# Patient Record
Sex: Male | Born: 1935 | Race: White | Hispanic: No | Marital: Married | Smoking: Former smoker
Health system: Southern US, Community
[De-identification: ages and names within clinical notes are randomized; demographics above are authoritative.]

## PROBLEM LIST (undated history)

## (undated) DIAGNOSIS — M1990 Unspecified osteoarthritis, unspecified site: Secondary | ICD-10-CM

## (undated) DIAGNOSIS — G629 Polyneuropathy, unspecified: Secondary | ICD-10-CM

## (undated) DIAGNOSIS — R7303 Prediabetes: Secondary | ICD-10-CM

## (undated) DIAGNOSIS — I10 Essential (primary) hypertension: Secondary | ICD-10-CM

## (undated) HISTORY — PX: EYE SURGERY: SHX253

## (undated) HISTORY — PX: HERNIA REPAIR: SHX51

## (undated) HISTORY — PX: FINGER AMPUTATION: SHX636

## (undated) HISTORY — PX: HEMORRHOID SURGERY: SHX153

---

## 2015-03-11 ENCOUNTER — Ambulatory Visit (HOSPITAL_COMMUNITY)
Admission: EM | Admit: 2015-03-11 | Discharge: 2015-03-12 | Disposition: A | Discharge status: Home / Self Care | Payer: Medicare HMO | Attending: Orthopedic Surgery | Admitting: Orthopedic Surgery

## 2015-03-11 ENCOUNTER — Encounter (HOSPITAL_COMMUNITY): Payer: Self-pay

## 2015-03-11 ENCOUNTER — Emergency Department (HOSPITAL_COMMUNITY): Payer: Medicare HMO

## 2015-03-11 DIAGNOSIS — S66324A Laceration of extensor muscle, fascia and tendon of right ring finger at wrist and hand level, initial encounter: Secondary | ICD-10-CM | POA: Diagnosis not present

## 2015-03-11 DIAGNOSIS — Z79899 Other long term (current) drug therapy: Secondary | ICD-10-CM | POA: Insufficient documentation

## 2015-03-11 DIAGNOSIS — S67194A Crushing injury of right ring finger, initial encounter: Secondary | ICD-10-CM | POA: Diagnosis not present

## 2015-03-11 DIAGNOSIS — S6990XA Unspecified injury of unspecified wrist, hand and finger(s), initial encounter: Secondary | ICD-10-CM | POA: Diagnosis present

## 2015-03-11 DIAGNOSIS — S65514A Laceration of blood vessel of right ring finger, initial encounter: Secondary | ICD-10-CM | POA: Diagnosis not present

## 2015-03-11 DIAGNOSIS — S61219A Laceration without foreign body of unspecified finger without damage to nail, initial encounter: Secondary | ICD-10-CM

## 2015-03-11 DIAGNOSIS — R7303 Prediabetes: Secondary | ICD-10-CM | POA: Diagnosis not present

## 2015-03-11 DIAGNOSIS — S61212A Laceration without foreign body of right middle finger without damage to nail, initial encounter: Secondary | ICD-10-CM | POA: Diagnosis not present

## 2015-03-11 DIAGNOSIS — Y9389 Activity, other specified: Secondary | ICD-10-CM | POA: Diagnosis not present

## 2015-03-11 DIAGNOSIS — W3189XA Contact with other specified machinery, initial encounter: Secondary | ICD-10-CM | POA: Insufficient documentation

## 2015-03-11 DIAGNOSIS — S62609B Fracture of unspecified phalanx of unspecified finger, initial encounter for open fracture: Secondary | ICD-10-CM

## 2015-03-11 DIAGNOSIS — S62624B Displaced fracture of medial phalanx of right ring finger, initial encounter for open fracture: Secondary | ICD-10-CM | POA: Diagnosis not present

## 2015-03-11 DIAGNOSIS — S67192A Crushing injury of right middle finger, initial encounter: Secondary | ICD-10-CM | POA: Diagnosis not present

## 2015-03-11 DIAGNOSIS — R52 Pain, unspecified: Secondary | ICD-10-CM

## 2015-03-11 DIAGNOSIS — Z87891 Personal history of nicotine dependence: Secondary | ICD-10-CM | POA: Diagnosis not present

## 2015-03-11 DIAGNOSIS — G629 Polyneuropathy, unspecified: Secondary | ICD-10-CM | POA: Diagnosis not present

## 2015-03-11 DIAGNOSIS — I10 Essential (primary) hypertension: Secondary | ICD-10-CM | POA: Diagnosis not present

## 2015-03-11 HISTORY — DX: Essential (primary) hypertension: I10

## 2015-03-11 HISTORY — DX: Polyneuropathy, unspecified: G62.9

## 2015-03-11 HISTORY — DX: Prediabetes: R73.03

## 2015-03-11 MED ORDER — CEFAZOLIN SODIUM 1-5 GM-% IV SOLN
1.0000 g | Freq: Once | INTRAVENOUS | Status: AC
  Administered 2015-03-11: 1 g via INTRAVENOUS
  Filled 2015-03-11: qty 50

## 2015-03-11 MED ORDER — LIDOCAINE HCL 2 % IJ SOLN
10.0000 mL | Freq: Once | INTRAMUSCULAR | Status: DC
  Filled 2015-03-11: qty 20

## 2015-03-11 MED ORDER — HYDROCODONE-ACETAMINOPHEN 5-325 MG PO TABS
2.0000 | ORAL_TABLET | Freq: Once | ORAL | Status: AC
Start: 2015-03-11 — End: 2015-03-11
  Administered 2015-03-11: 2 via ORAL
  Filled 2015-03-11: qty 2

## 2015-03-11 MED ORDER — MORPHINE SULFATE (PF) 4 MG/ML IV SOLN: 4.0000 mg | Freq: Once | INTRAVENOUS | Status: DC

## 2015-03-11 NOTE — ED Provider Notes (Signed)
CSN: [id number]     Arrival date & time 03/11/15  1848 History   First MD Initiated Contact with Patient 03/11/15 1851     Chief Complaint  Patient presents with  . Finger Injury     (Consider location/radiation/quality/duration/timing/severity/associated sxs/prior Treatment) Patient is a 80 y.o. male presenting with hand injury.  Hand Injury Location:  Finger Time since incident:  1 hour Injury: yes   Mechanism of injury: crush   Crush injury:    Mechanism: wood splitter. Finger location:  R middle finger and R ring finger Pain details:    Radiates to:  Does not radiate   Severity:  Moderate   Onset quality:  Sudden   Duration:  1 hour   Timing:  Constant   Progression:  Unchanged Handedness:  Right-handed Tetanus status:  Up to date Relieved by:  Nothing Worsened by:  Movement Ineffective treatments:  None tried Associated symptoms: swelling   Associated symptoms: no numbness     No past medical history on file. No past surgical history on file. No family history on file. Social History  Substance Use Topics  . Smoking status: Not on file  . Smokeless tobacco: Not on file  . Alcohol Use: Not on file    Review of Systems  All other systems reviewed and are negative.     Allergies  Review of patient's allergies indicates not on file.  Home Medications   Prior to Admission medications   Not on File   BP 133/81 mmHg  Pulse 69  Temp(Src) 98 F (36.7 C) (Oral)  Resp 18  SpO2 98% Physical Exam  Constitutional: He is oriented to person, place, and time. He appears well-developed and well-nourished. No distress.  HENT:  Head: Normocephalic and atraumatic.  Eyes: Conjunctivae are normal. No scleral icterus.  Neck: Neck supple.  Cardiovascular: Normal rate and intact distal pulses.   Pulmonary/Chest: Effort normal. No stridor. No respiratory distress.  Abdominal: Normal appearance. He exhibits no distension.  Musculoskeletal:        Hands: Neurological: He is alert and oriented to person, place, and time.  Skin: Skin is warm and dry. No rash noted.  Psychiatric: He has a normal mood and affect. His behavior is normal.  Nursing note and vitals reviewed.   ED Course  Procedures (including critical care time) Labs Review Labs Reviewed - No data to display  Imaging Review Dg Hand Complete Right  03/11/2015  CLINICAL DATA:  Right hand pain. Laceration to ring and middle fingers with wood splitter today. EXAM: RIGHT HAND - COMPLETE 3+ VIEW COMPARISON:  None. FINDINGS: Oblique mildly displaced fracture of the fourth digit middle phalanx, no intra-articular extension. No definite additional acute fracture, allowing for a difficulty with positioning and background chronic change. Soft tissue air is noted about the middle and ring fingers consistent with laceration with overlying dressing in place. No radiopaque foreign body. 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 Remote amputation of fifth digit at the level of the proximal phalanx. IMPRESSION: 1. Acute mildly displaced oblique fracture of the fourth digit middle phalanx. 2. Soft tissue air about the third and fourth digits consistent with laceration and soft tissue injury, no radiopaque foreign body. 3. 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 Electronically Signed   By: Rubye Oaks M.D.   On: 03/11/2015 19:51   I have personally reviewed and evaluated these images and lab results as  part of my medical decision-making.   EKG Interpretation None      MDM   Final diagnoses:  Finger fracture, right, open, initial encounter  Finger laceration, initial encounter    80 yo male who injured his right 3rd and 4th fingers while using a wood splitter.  Morphine for pain.  Plain films pending.   After pain meds facilitated better exam, he had deficit to extension of DIP on ring  finger.  Plain film showed fracture of 4th digit.  Given IV ancef. Tetanus is up to date.    I spoke with Dr. Merlyn LotKuzma at approximately 23:15 and he asked [facility].  ED will accept (Dr. Dalene SeltzerSchlossman).     Blake DivineJohn Kaiyla Stahly, MD 03/12/15 [phone]

## 2015-03-11 NOTE — ED Notes (Signed)
Bed: WA20 Expected date:  Expected time:  Means of arrival:  Comments: Hold for triage 

## 2015-03-11 NOTE — ED Notes (Signed)
Pt verbalizes understanding of transfer instructions.

## 2015-03-11 NOTE — ED Notes (Signed)
Pt c/o R middle and ring finger injury after getting them crushed in a wood splitter.  Pain score 5/10.  Denies blood thinners.  Compound fractures noted.

## 2015-03-11 NOTE — ED Notes (Signed)
Sterile non stick dressing applied to wound and wrapped in kerlix.

## 2015-03-12 ENCOUNTER — Emergency Department (HOSPITAL_COMMUNITY): Payer: Medicare HMO | Admitting: Anesthesiology

## 2015-03-12 ENCOUNTER — Emergency Department (HOSPITAL_COMMUNITY): Payer: Medicare HMO

## 2015-03-12 ENCOUNTER — Encounter (HOSPITAL_COMMUNITY)
Admission: EM | Disposition: A | Discharge status: Home / Self Care | Payer: Self-pay | Source: Home / Self Care | Attending: Emergency Medicine

## 2015-03-12 DIAGNOSIS — S67192A Crushing injury of right middle finger, initial encounter: Secondary | ICD-10-CM | POA: Diagnosis not present

## 2015-03-12 DIAGNOSIS — S61212A Laceration without foreign body of right middle finger without damage to nail, initial encounter: Secondary | ICD-10-CM | POA: Diagnosis not present

## 2015-03-12 DIAGNOSIS — S62624B Displaced fracture of medial phalanx of right ring finger, initial encounter for open fracture: Secondary | ICD-10-CM | POA: Diagnosis not present

## 2015-03-12 DIAGNOSIS — S67194A Crushing injury of right ring finger, initial encounter: Secondary | ICD-10-CM | POA: Diagnosis not present

## 2015-03-12 HISTORY — PX: I & D EXTREMITY: SHX5045

## 2015-03-12 LAB — CBC WITH DIFFERENTIAL/PLATELET
Basophils Absolute: 0.1 10*3/uL (ref 0.0–0.1)
Basophils Relative: 1 %
EOS ABS: 0.1 10*3/uL (ref 0.0–0.7)
Eosinophils Relative: 1 %
HCT: 37.9 % — ABNORMAL LOW (ref 39.0–52.0)
HEMOGLOBIN: 13 g/dL (ref 13.0–17.0)
Lymphocytes Relative: 23 %
Lymphs Abs: 2.1 10*3/uL (ref 0.7–4.0)
MCH: 30.8 pg (ref 26.0–34.0)
MCHC: 34.3 g/dL (ref 30.0–36.0)
MCV: 89.8 fL (ref 78.0–100.0)
Monocytes Absolute: 0.9 10*3/uL (ref 0.1–1.0)
Monocytes Relative: 10 %
NEUTROS PCT: 65 %
Neutro Abs: 6.1 10*3/uL (ref 1.7–7.7)
Platelets: 197 10*3/uL (ref 150–400)
RBC: 4.22 MIL/uL (ref 4.22–5.81)
RDW: 12.9 % (ref 11.5–15.5)
WBC: 9.3 10*3/uL (ref 4.0–10.5)

## 2015-03-12 LAB — BASIC METABOLIC PANEL
Anion gap: 8 (ref 5–15)
BUN: 29 mg/dL — ABNORMAL HIGH (ref 6–20)
CHLORIDE: 106 mmol/L (ref 101–111)
CO2: 22 mmol/L (ref 22–32)
CREATININE: 1.32 mg/dL — AB (ref 0.61–1.24)
Calcium: 8.9 mg/dL (ref 8.9–10.3)
GFR, EST AFRICAN AMERICAN: 58 mL/min — AB (ref >60)
GFR, EST NON AFRICAN AMERICAN: 50 mL/min — AB (ref >60)
Glucose, Bld: 109 mg/dL — ABNORMAL HIGH (ref 65–99)
POTASSIUM: 4.4 mmol/L (ref 3.5–5.1)
SODIUM: 136 mmol/L (ref 135–145)

## 2015-03-12 SURGERY — IRRIGATION AND DEBRIDEMENT EXTREMITY
Anesthesia: General | Site: Finger | Laterality: Right

## 2015-03-12 MED ORDER — ONDANSETRON HCL 4 MG/2ML IJ SOLN
INTRAMUSCULAR | Status: AC
  Filled 2015-03-12: qty 2

## 2015-03-12 MED ORDER — PROMETHAZINE HCL 25 MG/ML IJ SOLN
INTRAMUSCULAR | Status: AC
  Filled 2015-03-12: qty 1

## 2015-03-12 MED ORDER — PROPOFOL 10 MG/ML IV BOLUS
INTRAVENOUS | Status: DC | PRN
  Administered 2015-03-12: 160 mg via INTRAVENOUS

## 2015-03-12 MED ORDER — BUPIVACAINE HCL (PF) 0.25 % IJ SOLN
INTRAMUSCULAR | Status: AC
  Filled 2015-03-12: qty 30

## 2015-03-12 MED ORDER — ONDANSETRON HCL 4 MG/2ML IJ SOLN
INTRAMUSCULAR | Status: DC | PRN
  Administered 2015-03-12: 4 mg via INTRAVENOUS

## 2015-03-12 MED ORDER — SULFAMETHOXAZOLE-TRIMETHOPRIM 800-160 MG PO TABS: 1.0000 | ORAL_TABLET | Freq: Two times a day (BID) | ORAL | Status: DC

## 2015-03-12 MED ORDER — EPHEDRINE SULFATE 50 MG/ML IJ SOLN
INTRAMUSCULAR | Status: DC | PRN
  Administered 2015-03-12: 10 mg via INTRAVENOUS

## 2015-03-12 MED ORDER — FENTANYL CITRATE (PF) 250 MCG/5ML IJ SOLN
INTRAMUSCULAR | Status: AC
  Filled 2015-03-12: qty 5

## 2015-03-12 MED ORDER — CEFAZOLIN SODIUM-DEXTROSE 2-3 GM-% IV SOLR
INTRAVENOUS | Status: AC
  Filled 2015-03-12: qty 50

## 2015-03-12 MED ORDER — PROMETHAZINE HCL 25 MG/ML IJ SOLN
6.2500 mg | Freq: Once | INTRAMUSCULAR | Status: AC
  Administered 2015-03-12: 6.25 mg via INTRAVENOUS

## 2015-03-12 MED ORDER — SODIUM CHLORIDE 0.9 % IR SOLN
Status: DC | PRN
  Administered 2015-03-12: 3000 mL

## 2015-03-12 MED ORDER — LIDOCAINE HCL (CARDIAC) 20 MG/ML IV SOLN
INTRAVENOUS | Status: DC | PRN
  Administered 2015-03-12: 60 mg via INTRAVENOUS

## 2015-03-12 MED ORDER — OXYCODONE-ACETAMINOPHEN 5-325 MG PO TABS: ORAL_TABLET | ORAL | Status: DC

## 2015-03-12 MED ORDER — MIDAZOLAM HCL 2 MG/2ML IJ SOLN
INTRAMUSCULAR | Status: AC
  Filled 2015-03-12: qty 4

## 2015-03-12 MED ORDER — MORPHINE SULFATE (PF) 2 MG/ML IV SOLN
2.0000 mg | Freq: Once | INTRAVENOUS | Status: AC
  Administered 2015-03-12: 2 mg via INTRAVENOUS
  Filled 2015-03-12: qty 1

## 2015-03-12 MED ORDER — FENTANYL CITRATE (PF) 250 MCG/5ML IJ SOLN
INTRAMUSCULAR | Status: DC | PRN
  Administered 2015-03-12: 75 ug via INTRAVENOUS
  Administered 2015-03-12: 25 ug via INTRAVENOUS
  Administered 2015-03-12: 50 ug via INTRAVENOUS

## 2015-03-12 MED ORDER — LACTATED RINGERS IV SOLN
INTRAVENOUS | Status: DC | PRN
  Administered 2015-03-12 (×2): via INTRAVENOUS

## 2015-03-12 MED ORDER — BUPIVACAINE HCL (PF) 0.25 % IJ SOLN
INTRAMUSCULAR | Status: DC | PRN
  Administered 2015-03-12: 10 mL

## 2015-03-12 MED ORDER — MIDAZOLAM HCL 2 MG/2ML IJ SOLN
INTRAMUSCULAR | Status: DC | PRN
  Administered 2015-03-12: 2 mg via INTRAVENOUS

## 2015-03-12 MED ORDER — SODIUM CHLORIDE 0.9 % IR SOLN
Status: DC | PRN
  Administered 2015-03-12: 1

## 2015-03-12 MED ORDER — FENTANYL CITRATE (PF) 100 MCG/2ML IJ SOLN: 25.0000 ug | INTRAMUSCULAR | Status: DC | PRN

## 2015-03-12 MED ORDER — MORPHINE SULFATE (PF) 4 MG/ML IV SOLN
4.0000 mg | Freq: Once | INTRAVENOUS | Status: AC
  Administered 2015-03-12: 4 mg via INTRAVENOUS
  Filled 2015-03-12: qty 1

## 2015-03-12 MED ORDER — ONDANSETRON HCL 4 MG/2ML IJ SOLN
4.0000 mg | Freq: Once | INTRAMUSCULAR | Status: AC
  Administered 2015-03-12: 4 mg via INTRAVENOUS
  Filled 2015-03-12: qty 2

## 2015-03-12 MED ORDER — CEFAZOLIN SODIUM-DEXTROSE 2-3 GM-% IV SOLR
INTRAVENOUS | Status: DC | PRN
  Administered 2015-03-12: 2 g via INTRAVENOUS

## 2015-03-12 MED ORDER — SUCCINYLCHOLINE CHLORIDE 20 MG/ML IJ SOLN
INTRAMUSCULAR | Status: DC | PRN
  Administered 2015-03-12: 100 mg via INTRAVENOUS

## 2015-03-12 SURGICAL SUPPLY — 61 items
BANDAGE COBAN STERILE 2 (GAUZE/BANDAGES/DRESSINGS) IMPLANT
BANDAGE ELASTIC 3 VELCRO ST LF (GAUZE/BANDAGES/DRESSINGS) IMPLANT
BANDAGE ELASTIC 4 VELCRO ST LF (GAUZE/BANDAGES/DRESSINGS) ×3 IMPLANT
BNDG COHESIVE 1X5 TAN STRL LF (GAUZE/BANDAGES/DRESSINGS) IMPLANT
BNDG CONFORM 2 STRL LF (GAUZE/BANDAGES/DRESSINGS) IMPLANT
BNDG ESMARK 4X9 LF (GAUZE/BANDAGES/DRESSINGS) IMPLANT
BNDG GAUZE ELAST 4 BULKY (GAUZE/BANDAGES/DRESSINGS) ×3 IMPLANT
CORDS BIPOLAR (ELECTRODE) ×3 IMPLANT
COVER SURGICAL LIGHT HANDLE (MISCELLANEOUS) ×3 IMPLANT
DECANTER SPIKE VIAL GLASS SM (MISCELLANEOUS) ×3 IMPLANT
DRAIN PENROSE 1/4X12 LTX STRL (WOUND CARE) IMPLANT
DRAPE MICROSCOPE LEICA (MISCELLANEOUS) ×3 IMPLANT
DRAPE OEC MINIVIEW 54X84 (DRAPES) ×3 IMPLANT
DRSG ADAPTIC 3X8 NADH LF (GAUZE/BANDAGES/DRESSINGS) IMPLANT
DRSG EMULSION OIL 3X3 NADH (GAUZE/BANDAGES/DRESSINGS) IMPLANT
DRSG PAD ABDOMINAL 8X10 ST (GAUZE/BANDAGES/DRESSINGS) ×6 IMPLANT
GAUZE SPONGE 4X4 12PLY STRL (GAUZE/BANDAGES/DRESSINGS) IMPLANT
GAUZE XEROFORM 1X8 LF (GAUZE/BANDAGES/DRESSINGS) ×3 IMPLANT
GLOVE BIO SURGEON STRL SZ7.5 (GLOVE) ×3 IMPLANT
GLOVE BIOGEL PI IND STRL 8 (GLOVE) ×1 IMPLANT
GLOVE BIOGEL PI INDICATOR 8 (GLOVE) ×2
GOWN STRL REUS W/ TWL LRG LVL3 (GOWN DISPOSABLE) ×1 IMPLANT
GOWN STRL REUS W/TWL LRG LVL3 (GOWN DISPOSABLE) ×2
K-WIRE 0.9MMX 102MM ×6 IMPLANT
KIT BASIN OR (CUSTOM PROCEDURE TRAY) ×3 IMPLANT
KIT ROOM TURNOVER OR (KITS) ×3 IMPLANT
LOOP VESSEL MAXI BLUE (MISCELLANEOUS) IMPLANT
LOOP VESSEL MINI RED (MISCELLANEOUS) IMPLANT
MANIFOLD NEPTUNE II (INSTRUMENTS) ×3 IMPLANT
NEEDLE HYPO 25X1 1.5 SAFETY (NEEDLE) ×3 IMPLANT
NS IRRIG 1000ML POUR BTL (IV SOLUTION) ×3 IMPLANT
PACK ORTHO EXTREMITY (CUSTOM PROCEDURE TRAY) ×3 IMPLANT
PAD ARMBOARD 7.5X6 YLW CONV (MISCELLANEOUS) ×6 IMPLANT
PADDING CAST ABS 4INX4YD NS (CAST SUPPLIES) ×2
PADDING CAST ABS COTTON 4X4 ST (CAST SUPPLIES) ×1 IMPLANT
SCRUB BETADINE 4OZ XXX (MISCELLANEOUS) ×3 IMPLANT
SET CYSTO W/LG BORE CLAMP LF (SET/KITS/TRAYS/PACK) ×3 IMPLANT
SOLUTION BETADINE 4OZ (MISCELLANEOUS) ×3 IMPLANT
SPLINT FIBERGLASS 3X12 (CAST SUPPLIES) ×3 IMPLANT
SPONGE GAUZE 4X4 12PLY STER LF (GAUZE/BANDAGES/DRESSINGS) ×3 IMPLANT
SPONGE LAP 18X18 X RAY DECT (DISPOSABLE) ×3 IMPLANT
SPONGE LAP 4X18 X RAY DECT (DISPOSABLE) ×3 IMPLANT
SUCTION FRAZIER TIP 10 FR DISP (SUCTIONS) ×3 IMPLANT
SUT CHROMIC 6 0 PS 4 (SUTURE) ×3 IMPLANT
SUT ETHILON 4 0 FS 1 (SUTURE) ×3 IMPLANT
SUT ETHILON 4 0 P 3 18 (SUTURE) ×6 IMPLANT
SUT ETHILON 4 0 PS 2 18 (SUTURE) ×3 IMPLANT
SUT ETHILON 9 0 BV130 4 (SUTURE) ×3 IMPLANT
SUT MERSILENE 4 0 P 3 (SUTURE) ×3 IMPLANT
SUT MON AB 5-0 P3 18 (SUTURE) IMPLANT
SUT PROLENE 3 0 PS 2 (SUTURE) ×3 IMPLANT
SYR CONTROL 10ML LL (SYRINGE) IMPLANT
TOWEL OR 17X24 6PK STRL BLUE (TOWEL DISPOSABLE) ×3 IMPLANT
TOWEL OR 17X26 10 PK STRL BLUE (TOWEL DISPOSABLE) ×3 IMPLANT
TUBE ANAEROBIC SPECIMEN COL (MISCELLANEOUS) IMPLANT
TUBE CONNECTING 12'X1/4 (SUCTIONS) ×1
TUBE CONNECTING 12X1/4 (SUCTIONS) ×2 IMPLANT
TUBE FEEDING 5FR 15 INCH (TUBING) IMPLANT
UNDERPAD 30X30 INCONTINENT (UNDERPADS AND DIAPERS) ×3 IMPLANT
WATER STERILE IRR 1000ML POUR (IV SOLUTION) ×3 IMPLANT
YANKAUER SUCT BULB TIP NO VENT (SUCTIONS) ×3 IMPLANT

## 2015-03-12 NOTE — Op Note (Signed)
NAME:  Jeffery Kennedy, Jeffery Kennedy                ACCOUNT NO.:  [id number]  MEDICAL RECORD NO.:  [id number]  LOCATION:  [facility]                         FACILITY:  [facility]  PHYSICIAN:  Betha LoaKevin Sudie Bandel, MD        DATE OF BIRTH:  [birth year]  DATE OF PROCEDURE:  03/12/2015 DATE OF DISCHARGE:                              OPERATIVE REPORT   PREOPERATIVE DIAGNOSIS:  Right long and ring finger crush injuries with open middle phalanx fracture, ring finger.  POSTOPERATIVE DIAGNOSIS:  Right long finger laceration with nail bed laceration, right long finger open middle phalanx fracture, extensor tendon laceration, and radial digital artery laceration.  PROCEDURE:   1. Irrigation and debridement including bone of right ring finger open middle phalanx fracture 2. Open reduction and pinning of right ring finger open middle phalanx fracture 3. Repair of extensor tendon right ring finger 4. Repair of right ring finger radial digital artery under microscope 5. Right long finger repair of laceration and nail bed lacerations.  SURGEON:  Betha LoaKevin Nyimah Shadduck, MD.  ASSISTANT:  None.  ANESTHESIA:  General.  IV FLUIDS:  Per anesthesia flow sheet.  ESTIMATED BLOOD LOSS:  Minimal.  COMPLICATIONS:  None.  SPECIMENS:  None.  TIME OF TOURNIQUET:  100 minutes.  DISPOSITION:  Stable to PACU.  INDICATIONS:  Mr. Jeffery Kennedy is a 80 year old right-hand dominant male, who yesterday evening injured his right hand while using a log splitter. While in the emergency room, he was evaluated and found to have open fracture.  [facility]  On evaluation, he had intact capillary refill in the fingertips.  He had decreased sensation in the ring finger.  He had lack of extension of the ring finger at the DIP joint.  There were lacerations of the ring and long finger.  Radiographs showed a fracture of the middle phalanx of the ring finger.  I recommended going to the operating room for irrigation and debridement of  open fracture, reduction and pinning of fracture, and repair of tendons as necessary.  Risks, benefits, and alternatives of surgery were discussed including risk of blood loss; infection; damage to nerves, vessels, tendons, ligaments, bone; failure of surgery; need for additional surgery; complications with wound healing; continued pain; nonunion; malunion; stiffness.  He voiced understanding of these risks and elected to proceed.  OPERATIVE COURSE:  Starting in operating room was delayed due to lack of open operating rooms available.  After being identified preoperatively by myself, the patient and I agreed upon procedure and site of procedure.  Surgical site was marked.  The risks, benefits, and alternatives of surgery were reviewed, and he wished to proceed. Surgical consent had been signed.  He was given IV Ancef as preoperative antibiotic coverage.  He had been given Ancef and his tetanus updated in the emergency department.  He was transferred to the operating room and placed on the operating table in supine position with the right upper extremity on arm board.  General anesthesia was induced by Anesthesiology.  Right upper extremity was prepped and draped in normal sterile orthopedic fashion.  Surgical pause was performed between surgeons, anesthesia, operating staff, and all were in agreement as to  the patient, procedure, and site of procedure.  Tourniquet at the proximal aspect of the extremity was inflated to 250 mmHg after exsanguination of limb with an Esmarch bandage.  The long finger was examined.  There was a laceration longitudinally.  There was some abrasion to the extensor tendon, but no complete laceration.  The laceration went into the nail.  The nail was removed.  There was laceration of the nail bed.  The ring finger was examined.  There was avulsion of the extensor tendon from the distal phalanx.  The joint was open.  The fracture was exposed in the middle phalanx.   The ulnar digital nerve and artery were identified and were intact.  The radial digital nerve was intact and the radial digital artery was lacerated past the trifurcation.  The wounds and open fracture were copiously irrigated with 4000 mL of sterile saline by cysto tubing.  A piece of  devascularlized bone was removed from the fracture site.  The skin and nail bed laceration of the long finger were repaired with 4-0 nylon suture in the skin and 6-0 chromic suture in the nail bed.  This was adequate to provide good apposition of tissues.  An open reduction of the ring finger middle phalanx fracture was performed.  Two 0.035-inch K- wires were then used in a crossed fashion from the proximal aspect of the phalanx across the fracture site and into the distal aspect of the phalanx.  This was adequate to stabilize the fracture.  The extensor tendon was repaired back down to the bone by passing a 3-0 Prolene in a horizontal mattress fashion over the top of the dorsal skin.  This was able to hold the tendon into apposition with the insertion of the distal phalanx.  A 0.035-inch K-wire had been advanced from the tip of the finger across the DIP joint to stabilize the joint.  The C-arm was used in AP, lateral, and oblique projections throughout the case to aid in reduction and positioning of hardware and good reduction was obtained. The pins were bent and cut short.  The microscope was brought in.  The radial digital artery was cleared of clot and repaired with a 9-0 nylon suture in an interrupted circumferential fashion.  This provided adequate apposition of the arterial ends.  Only a single one of the trifurcated artery branches was able to be repaired.  The skin was closed with 4-0 nylon in a horizontal mattress fashion.  The wounds were all dressed with sterile Xeroform, 4x4s, and wrapped with a Kerlix bandage.  The volar splint was placed and wrapped with Kerlix and Ace bandage.  Digital  block had been performed.  A 10 mL of 0.25% plain Marcaine to aid in postoperative analgesia.  The tourniquet was deflated at 100 minutes.  Fingertips were pink with capillary refill after deflation of tourniquet.  The operative drapes broken down.  The patient was awoken from anesthesia safely.  He was transferred back to stretcher and taken to PACU in stable condition.  I will see him back in the office in 1 week for postoperative followup.  I will give him Percocet 5/325, 1-2 p.o. q.6 hours p.r.n. pain, dispensed #40, and Bactrim DS 1 p.o. b.i.d. x7 days.     Betha Loa, MD     KK/MEDQ  D:  03/12/2015  T:  03/12/2015  Job:  [id number]

## 2015-03-12 NOTE — Anesthesia Preprocedure Evaluation (Addendum)
Anesthesia Evaluation  Patient identified by MRN, date of birth, ID band Patient awake    Reviewed: Allergy & Precautions, NPO status   Airway Mallampati: II  TM Distance: >3 FB Neck ROM: Full    Dental  (+) Teeth Intact, Dental Advisory Given   Pulmonary former smoker,    breath sounds clear to auscultation       Cardiovascular hypertension,  Rhythm:Regular Rate:Normal     Neuro/Psych    GI/Hepatic negative GI ROS, Neg liver ROS,   Endo/Other  negative endocrine ROS  Renal/GU negative Renal ROS     Musculoskeletal   Abdominal   Peds  Hematology   Anesthesia Other Findings   Reproductive/Obstetrics                           Anesthesia Physical Anesthesia Plan  ASA: III  Anesthesia Plan: General   Post-op Pain Management:    Induction: Intravenous  Airway Management Planned: Oral ETT  Additional Equipment:   Intra-op Plan:   Post-operative Plan: Extubation in OR  Informed Consent: I have reviewed the patients History and Physical, chart, labs and discussed the procedure including the risks, benefits and alternatives for the proposed anesthesia with the patient or authorized representative who has indicated his/her understanding and acceptance.     Plan Discussed with: CRNA and Anesthesiologist  Anesthesia Plan Comments:         Anesthesia Quick Evaluation

## 2015-03-12 NOTE — Anesthesia Procedure Notes (Signed)
Procedure Name: Intubation Date/Time: 03/12/2015 4:15 AM Performed by: Molli HazardGORDON, Adaya Garmany M Pre-anesthesia Checklist: Patient identified, Emergency Drugs available, Suction available and Patient being monitored Patient Re-evaluated:Patient Re-evaluated prior to inductionOxygen Delivery Method: Circle system utilized Preoxygenation: Pre-oxygenation with 100% oxygen Intubation Type: IV induction Ventilation: Mask ventilation without difficulty Laryngoscope Size: Glidescope Grade View: Grade I Tube size: 7.5 mm Number of attempts: 2 Airway Equipment and Method: Stylet and Video-laryngoscopy Placement Confirmation: ETT inserted through vocal cords under direct vision,  positive ETCO2 and breath sounds checked- equal and bilateral Secured at: 23 cm Tube secured with: Tape Dental Injury: Teeth and Oropharynx as per pre-operative assessment  Comments: DLX1 by TG, CRNA, with Hyacinth MeekerMiller 2; unable to visualize cords. Mask ventilate without difficulty. Glidescope used for grade 1 view and easy intubation.

## 2015-03-12 NOTE — Transfer of Care (Signed)
Immediate Anesthesia Transfer of Care Note  Patient: Jeffery Kennedy  Procedure(s) Performed: Procedure(s): IRRIGATION AND DEBRIDEMENT RIGHT LONG AND RING FINGER, PINNING TENDON REPAIR (Right)  Patient Location: PACU  Anesthesia Type:General  Level of Consciousness: sedated and patient cooperative  Airway & Oxygen Therapy: Patient connected to nasal cannula oxygen  Post-op Assessment: Report given to RN and Post -op Vital signs reviewed and stable  Post vital signs: Reviewed and stable  Last Vitals:  Filed Vitals:   03/12/15 0230  BP: 124/61  Pulse: 58  Temp:   Resp:     Complications: No apparent anesthesia complications

## 2015-03-12 NOTE — Anesthesia Postprocedure Evaluation (Signed)
  Anesthesia Post-op Note  Patient: Jeffery Kennedy  Procedure(s) Performed: Procedure(s): IRRIGATION AND DEBRIDEMENT RIGHT LONG AND RING FINGER, PINNING TENDON REPAIR (Right)  Patient Location: PACU  Anesthesia Type:General  Level of Consciousness: awake  Airway and Oxygen Therapy: Patient Spontanous Breathing  Post-op Pain: mild  Post-op Assessment: Post-op Vital signs reviewed              Post-op Vital Signs: Reviewed  Last Vitals:  Filed Vitals:   03/12/15 0715  BP: 127/67  Pulse: 65  Temp:   Resp: 11    Complications: No apparent anesthesia complications

## 2015-03-12 NOTE — Discharge Instructions (Signed)

## 2015-03-12 NOTE — ED Notes (Signed)
Spoke with Dr Merlyn LotKuzma to inform him that the pt's pain was starting to come back, Dr Merlyn LotKuzma gave verbal order to give the pt 2mg  of morphine.

## 2015-03-12 NOTE — ED Notes (Signed)
OR called and are ready for the patient. Pt going to Short stay room 36

## 2015-03-12 NOTE — Op Note (Signed)
[id number]

## 2015-03-12 NOTE — Brief Op Note (Signed)
03/11/2015 - 03/12/2015  6:13 AM  PATIENT:  Domenick GongJames Lee Behunin  80 y.o. male  PRE-OPERATIVE DIAGNOSIS:  open right ring fracture, right long laceration  POST-OPERATIVE DIAGNOSIS:  open right ring fracture, right long laceration  PROCEDURE:  Procedure(s): IRRIGATION AND DEBRIDEMENT RIGHT LONG AND RING FINGER, PINNING TENDON REPAIR (Right)  SURGEON:  Surgeon(s) and Role:    * Betha LoaKevin Duell Holdren, MD - Primary  PHYSICIAN ASSISTANT:   ASSISTANTS: none   ANESTHESIA:   general  EBL:  Total I/O In: 1100 [I.V.:1100] Out: -   BLOOD ADMINISTERED:none  DRAINS: none   LOCAL MEDICATIONS USED:  MARCAINE     SPECIMEN:  No Specimen  DISPOSITION OF SPECIMEN:  N/A  COUNTS:  YES  TOURNIQUET:   Total Tourniquet Time Documented: Upper Arm (Right) - 100 minutes Total: Upper Arm (Right) - 100 minutes   DICTATION: .Other Dictation: Dictation Number [id number]  PLAN OF CARE: Discharge to home after PACU  PATIENT DISPOSITION:  PACU - hemodynamically stable.

## 2015-03-12 NOTE — H&P (Signed)
Jeffery Kennedy is an 80 y.o. male.   Chief Complaint: right long/ring finger open fracture HPI: 80 yo rhd male states he was using log splitter yesterday evening and injured right long and ring fingers.  Seen at [facility] and sent to [facility] for further care.  Reports previous loss of portion of small finger.  No other injuries at this time.  Past Medical History  Diagnosis Date  . Hypertension   . Neuropathy ([place])   . Borderline diabetes     Past Surgical History  Procedure Laterality Date  . Finger amputation    . Hemorrhoid surgery    . Hernia repair      No family history on file. Social History:  reports that he has quit smoking. He does not have any smokeless tobacco history on file. He reports that he does not drink alcohol or use illicit drugs.  Allergies: Not on File   (Not in a hospital admission)  Results for orders placed or performed during the hospital encounter of 03/11/15 (from the past 48 hour(s))  CBC with Differential     Status: Abnormal   Collection Time: 03/12/15 12:45 AM  Result Value Ref Range   WBC 9.3 4.0 - 10.5 K/uL   RBC 4.22 4.22 - 5.81 MIL/uL   Hemoglobin 13.0 13.0 - 17.0 g/dL   HCT 37.9 (L) 39.0 - 52.0 %   MCV 89.8 78.0 - 100.0 fL   MCH 30.8 26.0 - 34.0 pg   MCHC 34.3 30.0 - 36.0 g/dL   RDW 12.9 11.5 - 15.5 %   Platelets 197 150 - 400 K/uL   Neutrophils Relative % 65 %   Neutro Abs 6.1 1.7 - 7.7 K/uL   Lymphocytes Relative 23 %   Lymphs Abs 2.1 0.7 - 4.0 K/uL   Monocytes Relative 10 %   Monocytes Absolute 0.9 0.1 - 1.0 K/uL   Eosinophils Relative 1 %   Eosinophils Absolute 0.1 0.0 - 0.7 K/uL   Basophils Relative 1 %   Basophils Absolute 0.1 0.0 - 0.1 K/uL  Basic metabolic panel     Status: Abnormal   Collection Time: 03/12/15 12:45 AM  Result Value Ref Range   Sodium 136 135 - 145 mmol/L   Potassium 4.4 3.5 - 5.1 mmol/L   Chloride 106 101 - 111 mmol/L   CO2 22 22 - 32 mmol/L   Glucose, Bld 109 (H) 65 - 99 mg/dL   BUN 29 (H) 6 - 20  mg/dL   Creatinine, Ser 1.32 (H) 0.61 - 1.24 mg/dL   Calcium 8.9 8.9 - 10.3 mg/dL   GFR calc non Af Amer 50 (L) >60 mL/min   GFR calc Af Amer 58 (L) >60 mL/min    Comment: (NOTE) The eGFR has been calculated using the CKD EPI equation. This calculation has not been validated in all clinical situations. eGFR's persistently <60 mL/min signify possible Chronic Kidney Disease.    Anion gap 8 5 - 15    Dg Chest 2 View  03/12/2015  CLINICAL DATA:  Preoperative chest radiograph.  Initial encounter. EXAM: CHEST  2 VIEW COMPARISON:  None. FINDINGS: The lungs are well-aerated and clear. There is no evidence of focal opacification, pleural effusion or pneumothorax. The heart is normal in size; the mediastinal contour is within normal limits. No acute osseous abnormalities are seen. IMPRESSION: No acute cardiopulmonary process seen. Electronically Signed   By: Garald Balding M.D.   On: 03/12/2015 00:59   Dg Hand Complete Right  03/11/2015  CLINICAL DATA:  Right hand pain. Laceration to ring and middle fingers with wood splitter today. EXAM: RIGHT HAND - COMPLETE 3+ VIEW COMPARISON:  None. FINDINGS: Oblique mildly displaced fracture of the fourth digit middle phalanx, no intra-articular extension. No definite additional acute fracture, allowing for a difficulty with positioning and background chronic change. Soft tissue air is noted about the middle and ring fingers consistent with laceration with overlying dressing in place. No radiopaque foreign body. 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 Remote amputation of fifth digit at the level of the proximal phalanx. IMPRESSION: 1. Acute mildly displaced oblique fracture of the fourth digit middle phalanx. 2. Soft tissue air about the third and fourth digits consistent with laceration and soft tissue injury, no radiopaque foreign body. 3. Advanced osteoarthritis of the digits and base  of the thumb. Electronically Signed   By: Jeb Levering M.D.   On: 03/11/2015 19:51     A comprehensive review of systems was negative.  Blood pressure 124/61, pulse 58, temperature 98 F (36.7 C), temperature source Oral, resp. rate 20, SpO2 96 %.  General appearance: alert, cooperative and appears stated age Head: Normocephalic, without obvious abnormality, atraumatic Neck: supple, symmetrical, trachea midline Resp: clear to auscultation bilaterally Cardio: regular rate and rhythm GI: non tender Extremities: intact sensation and capillary refill all digits.  +epl/fpl/io.  left ue: no wounds or ttp.  right ue: intact sensation and capillary refillall digits except that ring has less sensation.  brisk capillary refill all digits.  wounds on dorusm of long and ring fingers.  +fpl/epl/io.  +fds/fdp.  unable to extend dip long/ Pulses: 2+ and symmetric Skin: Skin color, texture, turgor normal. No rashes or lesions Neurologic: Grossly normal Incision/Wound: As above  Assessment/Plan Right long and ring finger crush injury with open middle phalanx fracture of ring finger and laceration to long and ring with possible tendon injury.  Recommend OR for irrigation and debridement, fixation of fracture, repair of tendon as necessary.  Risks, benefits, and alternatives of surgery were discussed and the patient agrees with the plan of care.   Ardenia Stiner R 03/12/2015, 3:24 AM

## 2015-03-12 NOTE — ED Provider Notes (Signed)
This is a normally healthy 80 year old male who had his hand caught in a wood splitter about 6 PM tonight.  [facility]  X-rays were taken, it was deemed necessary for Dr. Merlyn LotKuzma to perform hand surgery in the OR.  He was transferred here for definitive care.  On arrival, mild to moderate pain.  IV is in place.  He's having no respiratory difficulty, no shortness of breath, no chest pain.  He last ate at 3:15 PM.  Earley FavorGail Neeraj Housand, NP 03/12/15 [id number]  Doug SouSam Jacubowitz, MD 03/12/15 2326

## 2015-03-15 ENCOUNTER — Encounter (HOSPITAL_COMMUNITY): Payer: Self-pay | Admitting: Orthopedic Surgery

## 2016-02-28 ENCOUNTER — Ambulatory Visit: Payer: Self-pay | Admitting: Orthopedic Surgery

## 2016-03-30 NOTE — Patient Instructions (Addendum)
Jeffery Kennedy  03/30/2016   Your procedure is scheduled on: Monday April 10, 2016  [facility]  Entrance [place]  elevators to 3rd floor to  Short Stay Center at 8:30 AM.  Call this number if you have problems the morning of surgery [phone]   Remember: ONLY 1 PERSON MAY GO WITH YOU TO SHORT STAY TO GET  READY MORNING OF YOUR SURGERY.  Do not eat food or drink liquids :After Midnight.     Take these medicines the morning of surgery with A SIP OF WATER: Gabapentin                                 You may not have any metal on your body including hair pins and              piercings  Do not wear jewelry, , lotions, powders or colognes, deodorant                        Men may shave face and neck.   Do not bring valuables to the hospital. [place] IS NOT             RESPONSIBLE   FOR VALUABLES.  Contacts, dentures or bridgework may not be worn into surgery.  Leave suitcase in the car. After surgery it may be brought to your room.                Please read over the following fact sheets you were given:MRSA INFORMATION SHEET; INCENTIVE SPIROMETER; BLOOD TRANSFUSION INFORMATION SHEET  _____________________________________________________________________             [facility] - Preparing for Surgery Before surgery, you can play an important role.  Because skin is not sterile, your skin needs to be as free of germs as possible.  You can reduce the number of germs on your skin by washing with CHG (chlorahexidine gluconate) soap before surgery.  CHG is an antiseptic cleaner which kills germs and bonds with the skin to continue killing germs even after washing. Please DO NOT use if you have an allergy to CHG or antibacterial soaps.  If your skin becomes reddened/irritated stop using the CHG and inform your nurse when you arrive at Short Stay. Do not shave (including legs and underarms) for at least 48 hours prior to the first CHG shower.   You may shave your face/neck. Please follow these instructions carefully:  1.  Shower with CHG Soap the night before surgery and the  morning of Surgery.  2.  If you choose to wash your hair, wash your hair first as usual with your  normal  shampoo.  3.  After you shampoo, rinse your hair and body thoroughly to remove the  shampoo.                           4.  Use CHG as you would any other liquid soap.  You can apply chg directly  to the skin and wash                       Gently with a scrungie or clean washcloth.  5.  Apply the CHG Soap to your body ONLY FROM THE  NECK DOWN.   Do not use on face/ open                           Wound or open sores. Avoid contact with eyes, ears mouth and genitals (private parts).                       Wash face,  Genitals (private parts) with your normal soap.             6.  Wash thoroughly, paying special attention to the area where your surgery  will be performed.  7.  Thoroughly rinse your body with warm water from the neck down.  8.  DO NOT shower/wash with your normal soap after using and rinsing off  the CHG Soap.                9.  Pat yourself dry with a clean towel.            10.  Wear clean pajamas.            11.  Place clean sheets on your bed the night of your first shower and do not  sleep with pets. Day of Surgery : Do not apply any lotions/deodorants the morning of surgery.  Please wear clean clothes to the hospital/surgery center.  FAILURE TO FOLLOW THESE INSTRUCTIONS MAY RESULT IN THE CANCELLATION OF YOUR SURGERY PATIENT SIGNATURE_________________________________  NURSE SIGNATURE__________________________________  ________________________________________________________________________   Jeffery Kennedy  An incentive spirometer is a tool that can help keep your lungs clear and active. This tool measures how well you are filling your lungs with each breath. Taking long deep breaths may help reverse or decrease the chance of  developing breathing (pulmonary) problems (especially infection) following:  A long period of time when you are unable to move or be active. BEFORE THE PROCEDURE   If the spirometer includes an indicator to show your best effort, your nurse or respiratory therapist will set it to a desired goal.  If possible, sit up straight or lean slightly forward. Try not to slouch.  Hold the incentive spirometer in an upright position. INSTRUCTIONS FOR USE  1. Sit on the edge of your bed if possible, or sit up as far as you can in bed or on a chair. 2. Hold the incentive spirometer in an upright position. 3. Breathe out normally. 4. Place the mouthpiece in your mouth and seal your lips tightly around it. 5. Breathe in slowly and as deeply as possible, raising the piston or the ball toward the top of the column. 6. Hold your breath for 3-5 seconds or for as long as possible. Allow the piston or ball to fall to the bottom of the column. 7. Remove the mouthpiece from your mouth and breathe out normally. 8. Rest for a few seconds and repeat Steps 1 through 7 at least 10 times every 1-2 hours when you are awake. Take your time and take a few normal breaths between deep breaths. 9. The spirometer may include an indicator to show your best effort. Use the indicator as a goal to work toward during each repetition. 10. After each set of 10 deep breaths, practice coughing to be sure your lungs are clear. If you have an incision (the cut made at the time of surgery), support your incision when coughing by placing a pillow or rolled up towels firmly against it. Once you are able  to get out of bed, walk around indoors and cough well. You may stop using the incentive spirometer when instructed by your caregiver.  RISKS AND COMPLICATIONS  Take your time so you do not get dizzy or light-headed.  If you are in pain, you may need to take or ask for pain medication before doing incentive spirometry. It is harder to take a  deep breath if you are having pain. AFTER USE  Rest and breathe slowly and easily.  It can be helpful to keep track of a log of your progress. Your caregiver can provide you with a simple table to help with this. If you are using the spirometer at home, follow these instructions: [place] IF:   You are having difficultly using the spirometer.  You have trouble using the spirometer as often as instructed.  Your pain medication is not giving enough relief while using the spirometer.  You develop fever of 100.5 F (38.1 C) or higher. SEEK IMMEDIATE MEDICAL CARE IF:   You cough up bloody sputum that had not been present before.  You develop fever of 102 F (38.9 C) or greater.  You develop worsening pain at or near the incision site. MAKE SURE YOU:   Understand these instructions.  Will watch your condition.  Will get help right away if you are not doing well or get worse. Document Released: 09/18/2006 Document Revised: 07/31/2011 Document Reviewed: 11/19/2006 ExitCare Patient Information 2014 ExitCare, [place].   ________________________________________________________________________  WHAT IS A BLOOD TRANSFUSION? Blood Transfusion Information  A transfusion is the replacement of blood or some of its parts. Blood is made up of multiple cells which provide different functions.  Red blood cells carry oxygen and are used for blood loss replacement.  White blood cells fight against infection.  Platelets control bleeding.  Plasma helps clot blood.  Other blood products are available for specialized needs, such as hemophilia or other clotting disorders. BEFORE THE TRANSFUSION  Who gives blood for transfusions?   Healthy volunteers who are fully evaluated to make sure their blood is safe. This is blood bank blood. Transfusion therapy is the safest it has ever been in the practice of medicine. Before blood is taken from a donor, a complete history is taken to make  sure that person has no history of diseases nor engages in risky social behavior (examples are intravenous drug use or sexual activity with multiple partners). The donor's travel history is screened to minimize risk of transmitting infections, such as malaria. The donated blood is tested for signs of infectious diseases, such as HIV and hepatitis. The blood is then tested to be sure it is compatible with you in order to minimize the chance of a transfusion reaction. If you or a relative donates blood, this is often done in anticipation of surgery and is not appropriate for emergency situations. It takes many days to process the donated blood. RISKS AND COMPLICATIONS Although transfusion therapy is very safe and saves many lives, the main dangers of transfusion include:   Getting an infectious disease.  Developing a transfusion reaction. This is an allergic reaction to something in the blood you were given. Every precaution is taken to prevent this. The decision to have a blood transfusion has been considered carefully by your caregiver before blood is given. Blood is not given unless the benefits outweigh the risks. AFTER THE TRANSFUSION  Right after receiving a blood transfusion, you will usually feel much better and more energetic. This is especially true  if your red blood cells have gotten low (anemic). The transfusion raises the level of the red blood cells which carry oxygen, and this usually causes an energy increase.  The nurse administering the transfusion will monitor you carefully for complications. HOME CARE INSTRUCTIONS  No special instructions are needed after a transfusion. You may find your energy is better. Speak with your caregiver about any limitations on activity for underlying diseases you may have. SEEK MEDICAL CARE IF:   Your condition is not improving after your transfusion.  You develop redness or irritation at the intravenous (IV) site. SEEK IMMEDIATE MEDICAL CARE IF:   Any of the following symptoms occur over the next 12 hours:  Shaking chills.  You have a temperature by mouth above 102 F (38.9 C), not controlled by medicine.  Chest, back, or muscle pain.  People around you feel you are not acting correctly or are confused.  Shortness of breath or difficulty breathing.  Dizziness and fainting.  You get a rash or develop hives.  You have a decrease in urine output.  Your urine turns a dark color or changes to pink, red, or brown. Any of the following symptoms occur over the next 10 days:  You have a temperature by mouth above 102 F (38.9 C), not controlled by medicine.  Shortness of breath.  Weakness after normal activity.  The white part of the eye turns yellow (jaundice).  You have a decrease in the amount of urine or are urinating less often.  Your urine turns a dark color or changes to pink, red, or brown. Document Released: 05/05/2000 Document Revised: 07/31/2011 Document Reviewed: 12/23/2007 [facility] Patient Information 2014 [place], [place].  _______________________________________________________________________

## 2016-04-03 ENCOUNTER — Encounter (HOSPITAL_COMMUNITY)
Admission: RE | Admit: 2016-04-03 | Discharge: 2016-04-03 | Disposition: A | Discharge status: Home / Self Care | Payer: Medicare HMO | Source: Ambulatory Visit | Attending: Orthopedic Surgery | Admitting: Orthopedic Surgery

## 2016-04-03 ENCOUNTER — Encounter (HOSPITAL_COMMUNITY): Payer: Self-pay

## 2016-04-03 DIAGNOSIS — I10 Essential (primary) hypertension: Secondary | ICD-10-CM | POA: Insufficient documentation

## 2016-04-03 DIAGNOSIS — Z01818 Encounter for other preprocedural examination: Secondary | ICD-10-CM | POA: Insufficient documentation

## 2016-04-03 DIAGNOSIS — E119 Type 2 diabetes mellitus without complications: Secondary | ICD-10-CM | POA: Insufficient documentation

## 2016-04-03 HISTORY — DX: Prediabetes: R73.03

## 2016-04-03 HISTORY — DX: Unspecified osteoarthritis, unspecified site: M19.90

## 2016-04-03 LAB — CBC
HCT: 41.2 % (ref 39.0–52.0)
HEMOGLOBIN: 13.8 g/dL (ref 13.0–17.0)
MCH: 29.9 pg (ref 26.0–34.0)
MCHC: 33.5 g/dL (ref 30.0–36.0)
MCV: 89.4 fL (ref 78.0–100.0)
Platelets: 222 10*3/uL (ref 150–400)
RBC: 4.61 MIL/uL (ref 4.22–5.81)
RDW: 13.2 % (ref 11.5–15.5)
WBC: 6.2 10*3/uL (ref 4.0–10.5)

## 2016-04-03 LAB — COMPREHENSIVE METABOLIC PANEL
ALK PHOS: 65 U/L (ref 38–126)
ALT: 25 U/L (ref 17–63)
AST: 33 U/L (ref 15–41)
Albumin: 4.2 g/dL (ref 3.5–5.0)
Anion gap: 8 (ref 5–15)
BUN: 27 mg/dL — AB (ref 6–20)
CALCIUM: 9.3 mg/dL (ref 8.9–10.3)
CO2: 26 mmol/L (ref 22–32)
CREATININE: 1.15 mg/dL (ref 0.61–1.24)
Chloride: 104 mmol/L (ref 101–111)
GFR, EST NON AFRICAN AMERICAN: 59 mL/min — AB (ref >60)
Glucose, Bld: 104 mg/dL — ABNORMAL HIGH (ref 65–99)
Potassium: 4.9 mmol/L (ref 3.5–5.1)
SODIUM: 138 mmol/L (ref 135–145)
Total Bilirubin: 0.8 mg/dL (ref 0.3–1.2)
Total Protein: 7.5 g/dL (ref 6.5–8.1)

## 2016-04-03 LAB — URINALYSIS, ROUTINE W REFLEX MICROSCOPIC
BILIRUBIN URINE: NEGATIVE
GLUCOSE, UA: NEGATIVE mg/dL
HGB URINE DIPSTICK: NEGATIVE
KETONES UR: NEGATIVE mg/dL
Leukocytes, UA: NEGATIVE
Nitrite: NEGATIVE
PROTEIN: NEGATIVE mg/dL
Specific Gravity, Urine: 1.022 (ref 1.005–1.030)
pH: 6.5 (ref 5.0–8.0)

## 2016-04-03 LAB — SURGICAL PCR SCREEN
MRSA, PCR: NEGATIVE
STAPHYLOCOCCUS AUREUS: NEGATIVE

## 2016-04-03 LAB — PROTIME-INR
INR: 0.98
Prothrombin Time: 12.9 seconds (ref 11.4–15.2)

## 2016-04-03 LAB — ABO/RH: ABO/RH(D): O POS

## 2016-04-03 LAB — APTT: aPTT: 33 seconds (ref 24–36)

## 2016-04-03 NOTE — Progress Notes (Signed)
CMP results in epic per PAT visit 04/03/2016 sent to Dr Lequita HaltAluisio and Avel Peacerew Perkins PA

## 2016-04-03 NOTE — Progress Notes (Signed)
Clearance note per chart per der Petrivic 12/03/2015

## 2016-04-04 LAB — HEMOGLOBIN A1C
HEMOGLOBIN A1C: 6.4 % — AB (ref 4.8–5.6)
Mean Plasma Glucose: 137 mg/dL

## 2016-04-09 ENCOUNTER — Ambulatory Visit: Payer: Self-pay | Admitting: Orthopedic Surgery

## 2016-04-09 NOTE — H&P (Signed)
Jeffery Kennedy DOB: 1934-08-29 Married / Language: English / Race: White Male Date of Admission:  04/10/2016 CC:  Right Knee Pain History of Present Illness  The patient is a 81 year old male who comes in for a preoperative History and Physical. The patient is scheduled for a right total knee arthroplasty to be performed by Dr. Gus RankinFrank V. Aluisio, MD at [facility] on 04-10-2016. The patient is a 81 year old male who presented for follow up of their knee. The patient is being followed for their right knee pain and osteoarthritis. Symptoms reported include: pain (lateral side pain), swelling, giving way and pain with weightbearing. The patient feels that they are doing poorly and report their pain level to be mild. Current treatment includes: bracing. The following medication has been used for pain control: none. Jeffery Kennedy states that from standpoint of pain for the most part, he is doing well. He will get occasions where he can get very sharp pains in the knee if he torques the knee. He feels as though the bad problem he is having is with function. It is getting much harder to do things he desires. He has documented end stage arthritis tricompartmental in nature in that knee. He is ready to proceed with surgery. They have been treated conservatively in the past for the above stated problem and despite conservative measures, they continue to have progressive pain and severe functional limitations and dysfunction. They have failed non-operative management including home exercise, medications, and bracing. It is felt that they would benefit from undergoing total joint replacement. Risks and benefits of the procedure have been discussed with the patient and they elect to proceed with surgery. There are no active contraindications to surgery such as ongoing infection or rapidly progressive neurological disease.  Problem List/Past Medical Primary osteoarthritis of right knee (M17.11)  Diabetes  Mellitus, Type II  Diet-Controlled Only High blood pressure  Osteoarthritis  Peripheral Neuropathy  Allergies No Known Drug Allergies   Family History Depression  Sister. Diabetes Mellitus  Brother, Father. Osteoarthritis  Mother.  Social History  Children  3 Current work status  retired Scientist, physiologicalxercise  Exercises daily; does running / walking Living situation  live with spouse Marital status  married Never consumed alcohol  10/15/2015: Never consumed alcohol No history of drug/alcohol rehab  Not under pain contract  Number of flights of stairs before winded  greater than 5 Tobacco / smoke exposure  10/15/2015: no Tobacco use  Former smoker. 10/15/2015: smoke(d) 1 pack(s) per day Advance Directives  Living Will, Healthcare POA Post-Surgical Plans  Home with wife Imagene ShellerSue Steinborn  Medication History Lisinopril (40MG  Tablet, Oral) Active. Gabapentin (300MG  Capsule, Oral) Active.  Past Surgical History Cataract Surgery  bilateral Hemorrhoidectomy  Date: [id number]. Inguinal Hernia Repair  open: bilateral    Review of Systems General Not Present- Chills, Fatigue, Fever, Memory Loss, Night Sweats, Weight Gain and Weight Loss. Skin Not Present- Eczema, Hives, Itching, Lesions and Rash. HEENT Not Present- Dentures, Double Vision, Headache, Hearing Loss, Tinnitus and Visual Loss. Respiratory Not Present- Allergies, Chronic Cough, Coughing up blood, Shortness of breath at rest and Shortness of breath with exertion. Cardiovascular Not Present- Chest Pain, Difficulty Breathing Lying Down, Murmur, Palpitations, Racing/skipping heartbeats and Swelling. Gastrointestinal Not Present- Abdominal Pain, Bloody Stool, Constipation, Diarrhea, Difficulty Swallowing, Heartburn, Jaundice, Loss of appetitie, Nausea and Vomiting. Male Genitourinary Not Present- Blood in Urine, Discharge, Flank Pain, Incontinence, Painful Urination, Urgency, Urinary frequency, Urinary Retention, Urinating at  Night and Weak urinary  stream. Musculoskeletal Present- Joint Pain. Not Present- Back Pain, Joint Swelling, Morning Stiffness, Muscle Pain, Muscle Weakness and Spasms. Neurological Not Present- Blackout spells, Difficulty with balance, Dizziness, Paralysis, Tremor and Weakness. Psychiatric Not Present- Insomnia.  Vitals Weight: 165 lb Height: 67in Weight was reported by patient. Height was reported by patient. Body Surface Area: 1.86 m Body Mass Index: 25.84 kg/m  Pulse: 64 (Regular)  BP: 138/80 (Sitting, Left Arm, Standard)  Physical Exam General Mental Status -Alert, cooperative and good historian. General Appearance-pleasant, Not in acute distress. Orientation-Oriented X3. Build & Nutrition-Well nourished and Well developed.  Head and Neck Head-normocephalic, atraumatic . Neck Global Assessment - supple, no bruit auscultated on the right, no bruit auscultated on the left.  Eye Pupil - Bilateral-Irregular and Regular. Motion - Bilateral-EOMI.  Chest and Lung Exam Auscultation Breath sounds - clear at anterior chest wall and clear at posterior chest wall. Adventitious sounds - No Adventitious sounds.  Cardiovascular Auscultation Rhythm - Regular rate and rhythm. Heart Sounds - S1 WNL and S2 WNL. Murmurs & Other Heart Sounds - Auscultation of the heart reveals - No Murmurs.  Abdomen Palpation/Percussion Tenderness - Abdomen is non-tender to palpation. Rigidity (guarding) - Abdomen is soft. Auscultation Auscultation of the abdomen reveals - Bowel sounds normal.  Male Genitourinary Note: Not done, not pertinent to present illness   Musculoskeletal Note: Well-developed male in no distress. His right knee shows no effusion. His range is about 5 to 130. There is moderate crepitus on range of motion, diffuse tenderness, but no instability noted.  Radiographs from last visit in the spring and he has bone on bone arthritis of all three  compartments.   Assessment & Plan Primary osteoarthritis of right knee (M17.11)  Note:Surgical Plans: Right Total Knee Replacement  Disposition: Home with wife Fannie KneeSue  PCP: Dr. Pernell DuprePetrovic - Patient has been seen preoperatively and felt to be stable for surgery.  IV TXA  Anesthesia Issues: None  Veritas Study Patient VIRTUAL THERAPY  Signed electronically by Beckey RutterAlezandrew L Tzvi Economou, III PA-C

## 2016-04-10 ENCOUNTER — Encounter (HOSPITAL_COMMUNITY)
Admission: RE | Disposition: A | Discharge status: Home Health | Payer: Self-pay | Source: Ambulatory Visit | Attending: Orthopedic Surgery

## 2016-04-10 ENCOUNTER — Inpatient Hospital Stay (HOSPITAL_COMMUNITY): Payer: Medicare HMO | Admitting: Certified Registered"

## 2016-04-10 ENCOUNTER — Encounter (HOSPITAL_COMMUNITY): Payer: Self-pay | Admitting: *Deleted

## 2016-04-10 ENCOUNTER — Inpatient Hospital Stay (HOSPITAL_COMMUNITY)
Admission: RE | Admit: 2016-04-10 | Discharge: 2016-04-12 | DRG: 470 | Disposition: A | Discharge status: Home Health | Payer: Medicare HMO | Source: Ambulatory Visit | Attending: Orthopedic Surgery | Admitting: Orthopedic Surgery

## 2016-04-10 DIAGNOSIS — Z818 Family history of other mental and behavioral disorders: Secondary | ICD-10-CM | POA: Diagnosis not present

## 2016-04-10 DIAGNOSIS — M25561 Pain in right knee: Secondary | ICD-10-CM | POA: Diagnosis present

## 2016-04-10 DIAGNOSIS — I10 Essential (primary) hypertension: Secondary | ICD-10-CM | POA: Diagnosis present

## 2016-04-10 DIAGNOSIS — E1140 Type 2 diabetes mellitus with diabetic neuropathy, unspecified: Secondary | ICD-10-CM | POA: Diagnosis present

## 2016-04-10 DIAGNOSIS — Z8261 Family history of arthritis: Secondary | ICD-10-CM

## 2016-04-10 DIAGNOSIS — M1711 Unilateral primary osteoarthritis, right knee: Principal | ICD-10-CM | POA: Diagnosis present

## 2016-04-10 DIAGNOSIS — Z833 Family history of diabetes mellitus: Secondary | ICD-10-CM

## 2016-04-10 DIAGNOSIS — M179 Osteoarthritis of knee, unspecified: Secondary | ICD-10-CM | POA: Diagnosis present

## 2016-04-10 DIAGNOSIS — M1710 Unilateral primary osteoarthritis, unspecified knee: Secondary | ICD-10-CM

## 2016-04-10 HISTORY — PX: TOTAL KNEE ARTHROPLASTY: SHX125

## 2016-04-10 LAB — TYPE AND SCREEN
ABO/RH(D): O POS
Antibody Screen: NEGATIVE

## 2016-04-10 SURGERY — ARTHROPLASTY, KNEE, TOTAL
Anesthesia: Spinal | Site: Knee | Laterality: Right

## 2016-04-10 MED ORDER — CEFAZOLIN SODIUM-DEXTROSE 2-4 GM/100ML-% IV SOLN
INTRAVENOUS | Status: AC
  Filled 2016-04-10: qty 100

## 2016-04-10 MED ORDER — BUPIVACAINE IN DEXTROSE 0.75-8.25 % IT SOLN
INTRATHECAL | Status: DC | PRN
  Administered 2016-04-10: 1.8 mL via INTRATHECAL

## 2016-04-10 MED ORDER — BUPIVACAINE HCL (PF) 0.25 % IJ SOLN
INTRAMUSCULAR | Status: AC
  Filled 2016-04-10: qty 30

## 2016-04-10 MED ORDER — CEFAZOLIN SODIUM-DEXTROSE 2-4 GM/100ML-% IV SOLN
2.0000 g | Freq: Four times a day (QID) | INTRAVENOUS | Status: AC
  Administered 2016-04-10 (×2): 2 g via INTRAVENOUS
  Filled 2016-04-10: qty 100

## 2016-04-10 MED ORDER — PROPOFOL 10 MG/ML IV BOLUS
INTRAVENOUS | Status: AC
  Filled 2016-04-10: qty 20

## 2016-04-10 MED ORDER — EPHEDRINE 5 MG/ML INJ
INTRAVENOUS | Status: AC
Start: 2016-04-10 — End: 2016-04-10
  Filled 2016-04-10: qty 10

## 2016-04-10 MED ORDER — ACETAMINOPHEN 325 MG PO TABS
650.0000 mg | ORAL_TABLET | Freq: Four times a day (QID) | ORAL | Status: DC | PRN
  Administered 2016-04-11: 650 mg via ORAL
  Filled 2016-04-10: qty 2

## 2016-04-10 MED ORDER — POLYETHYLENE GLYCOL 3350 17 G PO PACK: 17.0000 g | PACK | Freq: Every day | ORAL | Status: DC | PRN

## 2016-04-10 MED ORDER — FENTANYL CITRATE (PF) 100 MCG/2ML IJ SOLN
INTRAMUSCULAR | Status: DC | PRN
  Administered 2016-04-10: 100 ug via INTRAVENOUS

## 2016-04-10 MED ORDER — METHOCARBAMOL 500 MG PO TABS
500.0000 mg | ORAL_TABLET | Freq: Four times a day (QID) | ORAL | Status: DC | PRN
  Administered 2016-04-11: 500 mg via ORAL
  Filled 2016-04-10: qty 1

## 2016-04-10 MED ORDER — LIDOCAINE 2% (20 MG/ML) 5 ML SYRINGE
INTRAMUSCULAR | Status: DC | PRN
  Administered 2016-04-10: 100 mg via INTRAVENOUS

## 2016-04-10 MED ORDER — SODIUM CHLORIDE 0.9 % IJ SOLN
INTRAMUSCULAR | Status: AC
  Filled 2016-04-10: qty 50

## 2016-04-10 MED ORDER — BUPIVACAINE HCL 0.25 % IJ SOLN
INTRAMUSCULAR | Status: DC | PRN
  Administered 2016-04-10: 20 mL

## 2016-04-10 MED ORDER — ACETAMINOPHEN 650 MG RE SUPP: 650.0000 mg | Freq: Four times a day (QID) | RECTAL | Status: DC | PRN

## 2016-04-10 MED ORDER — BUPIVACAINE LIPOSOME 1.3 % IJ SUSP
20.0000 mL | Freq: Once | INTRAMUSCULAR | Status: DC
  Filled 2016-04-10: qty 20

## 2016-04-10 MED ORDER — DEXAMETHASONE SODIUM PHOSPHATE 10 MG/ML IJ SOLN
INTRAMUSCULAR | Status: AC
  Filled 2016-04-10: qty 1

## 2016-04-10 MED ORDER — HYDROMORPHONE HCL 1 MG/ML IJ SOLN: 0.2500 mg | INTRAMUSCULAR | Status: DC | PRN

## 2016-04-10 MED ORDER — DOCUSATE SODIUM 100 MG PO CAPS
100.0000 mg | ORAL_CAPSULE | Freq: Two times a day (BID) | ORAL | Status: DC
  Administered 2016-04-11 – 2016-04-12 (×3): 100 mg via ORAL
  Filled 2016-04-10 (×4): qty 1

## 2016-04-10 MED ORDER — SODIUM CHLORIDE 0.9 % IV SOLN
INTRAVENOUS | Status: DC
  Administered 2016-04-10 – 2016-04-11 (×2): via INTRAVENOUS

## 2016-04-10 MED ORDER — ONDANSETRON HCL 4 MG/2ML IJ SOLN
INTRAMUSCULAR | Status: DC | PRN
  Administered 2016-04-10: 4 mg via INTRAVENOUS

## 2016-04-10 MED ORDER — LIDOCAINE 2% (20 MG/ML) 5 ML SYRINGE
INTRAMUSCULAR | Status: AC
Start: 2016-04-10 — End: 2016-04-10
  Filled 2016-04-10: qty 5

## 2016-04-10 MED ORDER — ACETAMINOPHEN 10 MG/ML IV SOLN
INTRAVENOUS | Status: AC
  Filled 2016-04-10: qty 100

## 2016-04-10 MED ORDER — DEXAMETHASONE SODIUM PHOSPHATE 10 MG/ML IJ SOLN
10.0000 mg | Freq: Once | INTRAMUSCULAR | Status: AC
  Administered 2016-04-11: 10 mg via INTRAVENOUS
  Filled 2016-04-10: qty 1

## 2016-04-10 MED ORDER — DIPHENHYDRAMINE HCL 12.5 MG/5ML PO ELIX: 12.5000 mg | ORAL_SOLUTION | ORAL | Status: DC | PRN

## 2016-04-10 MED ORDER — DEXTROSE 5 % IV SOLN
500.0000 mg | Freq: Four times a day (QID) | INTRAVENOUS | Status: DC | PRN
  Administered 2016-04-10: 500 mg via INTRAVENOUS
  Filled 2016-04-10: qty 550
  Filled 2016-04-10: qty 5

## 2016-04-10 MED ORDER — SODIUM CHLORIDE 0.9 % IJ SOLN
INTRAMUSCULAR | Status: DC | PRN
  Administered 2016-04-10: 30 mL

## 2016-04-10 MED ORDER — DEXAMETHASONE SODIUM PHOSPHATE 10 MG/ML IJ SOLN
10.0000 mg | Freq: Once | INTRAMUSCULAR | Status: AC
  Administered 2016-04-10: 10 mg via INTRAVENOUS

## 2016-04-10 MED ORDER — ACETAMINOPHEN 500 MG PO TABS
1000.0000 mg | ORAL_TABLET | Freq: Four times a day (QID) | ORAL | Status: AC
  Administered 2016-04-10 – 2016-04-11 (×4): 1000 mg via ORAL
  Filled 2016-04-10 (×4): qty 2

## 2016-04-10 MED ORDER — FLEET ENEMA 7-19 GM/118ML RE ENEM: 1.0000 | ENEMA | Freq: Once | RECTAL | Status: DC | PRN

## 2016-04-10 MED ORDER — TRANEXAMIC ACID 1000 MG/10ML IV SOLN
1000.0000 mg | INTRAVENOUS | Status: AC
  Administered 2016-04-10: 1000 mg via INTRAVENOUS
  Filled 2016-04-10: qty 1100

## 2016-04-10 MED ORDER — LACTATED RINGERS IV SOLN
INTRAVENOUS | Status: DC
  Administered 2016-04-10 (×2): via INTRAVENOUS

## 2016-04-10 MED ORDER — METOCLOPRAMIDE HCL 5 MG PO TABS: 5.0000 mg | ORAL_TABLET | Freq: Three times a day (TID) | ORAL | Status: DC | PRN

## 2016-04-10 MED ORDER — BISACODYL 10 MG RE SUPP
10.0000 mg | Freq: Every day | RECTAL | Status: DC | PRN
Start: 2016-04-10 — End: 2016-04-12

## 2016-04-10 MED ORDER — TRAMADOL HCL 50 MG PO TABS: 50.0000 mg | ORAL_TABLET | Freq: Four times a day (QID) | ORAL | Status: DC | PRN

## 2016-04-10 MED ORDER — PROMETHAZINE HCL 25 MG/ML IJ SOLN: 6.2500 mg | INTRAMUSCULAR | Status: DC | PRN

## 2016-04-10 MED ORDER — RIVAROXABAN 10 MG PO TABS
10.0000 mg | ORAL_TABLET | Freq: Every day | ORAL | Status: DC
  Administered 2016-04-11 – 2016-04-12 (×2): 10 mg via ORAL
  Filled 2016-04-10 (×2): qty 1

## 2016-04-10 MED ORDER — MENTHOL 3 MG MT LOZG: 1.0000 | LOZENGE | OROMUCOSAL | Status: DC | PRN

## 2016-04-10 MED ORDER — METOCLOPRAMIDE HCL 5 MG/ML IJ SOLN: 5.0000 mg | Freq: Three times a day (TID) | INTRAMUSCULAR | Status: DC | PRN

## 2016-04-10 MED ORDER — ONDANSETRON HCL 4 MG PO TABS: 4.0000 mg | ORAL_TABLET | Freq: Four times a day (QID) | ORAL | Status: DC | PRN

## 2016-04-10 MED ORDER — EPHEDRINE SULFATE-NACL 50-0.9 MG/10ML-% IV SOSY
PREFILLED_SYRINGE | INTRAVENOUS | Status: DC | PRN
  Administered 2016-04-10 (×3): 10 mg via INTRAVENOUS

## 2016-04-10 MED ORDER — MIDAZOLAM HCL 5 MG/5ML IJ SOLN
INTRAMUSCULAR | Status: DC | PRN
  Administered 2016-04-10: 1 mg via INTRAVENOUS

## 2016-04-10 MED ORDER — FENTANYL CITRATE (PF) 100 MCG/2ML IJ SOLN
INTRAMUSCULAR | Status: AC
  Filled 2016-04-10: qty 2

## 2016-04-10 MED ORDER — PROPOFOL 500 MG/50ML IV EMUL
INTRAVENOUS | Status: DC | PRN
  Administered 2016-04-10: 75 ug/kg/min via INTRAVENOUS

## 2016-04-10 MED ORDER — BUPIVACAINE LIPOSOME 1.3 % IJ SUSP
INTRAMUSCULAR | Status: DC | PRN
  Administered 2016-04-10: 20 mL

## 2016-04-10 MED ORDER — ONDANSETRON HCL 4 MG/2ML IJ SOLN
4.0000 mg | Freq: Four times a day (QID) | INTRAMUSCULAR | Status: DC | PRN
  Administered 2016-04-10: 4 mg via INTRAVENOUS
  Filled 2016-04-10: qty 2

## 2016-04-10 MED ORDER — PHENOL 1.4 % MT LIQD
1.0000 | OROMUCOSAL | Status: DC | PRN
  Filled 2016-04-10: qty 177

## 2016-04-10 MED ORDER — OXYCODONE HCL 5 MG PO TABS
5.0000 mg | ORAL_TABLET | ORAL | Status: DC | PRN
  Administered 2016-04-10 – 2016-04-11 (×2): 5 mg via ORAL
  Administered 2016-04-11 – 2016-04-12 (×4): 10 mg via ORAL
  Filled 2016-04-10: qty 2
  Filled 2016-04-10: qty 1
  Filled 2016-04-10 (×4): qty 2

## 2016-04-10 MED ORDER — CEFAZOLIN SODIUM-DEXTROSE 2-4 GM/100ML-% IV SOLN
2.0000 g | INTRAVENOUS | Status: AC
  Administered 2016-04-10: 2 g via INTRAVENOUS
  Filled 2016-04-10: qty 100

## 2016-04-10 MED ORDER — GABAPENTIN 300 MG PO CAPS
300.0000 mg | ORAL_CAPSULE | Freq: Two times a day (BID) | ORAL | Status: DC
  Administered 2016-04-10 – 2016-04-12 (×4): 300 mg via ORAL
  Filled 2016-04-10 (×4): qty 1

## 2016-04-10 MED ORDER — SODIUM CHLORIDE 0.9 % IR SOLN
Status: DC | PRN
  Administered 2016-04-10: 1000 mL

## 2016-04-10 MED ORDER — CHLORHEXIDINE GLUCONATE 4 % EX LIQD: 60.0000 mL | Freq: Once | CUTANEOUS | Status: DC

## 2016-04-10 MED ORDER — MORPHINE SULFATE (PF) 2 MG/ML IV SOLN
1.0000 mg | INTRAVENOUS | Status: DC | PRN
  Filled 2016-04-10: qty 1

## 2016-04-10 MED ORDER — ACETAMINOPHEN 10 MG/ML IV SOLN
1000.0000 mg | Freq: Once | INTRAVENOUS | Status: AC
  Administered 2016-04-10: 1000 mg via INTRAVENOUS
  Filled 2016-04-10: qty 100

## 2016-04-10 MED ORDER — MIDAZOLAM HCL 2 MG/2ML IJ SOLN
INTRAMUSCULAR | Status: AC
  Filled 2016-04-10: qty 2

## 2016-04-10 MED ORDER — ONDANSETRON HCL 4 MG/2ML IJ SOLN
INTRAMUSCULAR | Status: AC
Start: 2016-04-10 — End: 2016-04-10
  Filled 2016-04-10: qty 2

## 2016-04-10 MED ORDER — TRANEXAMIC ACID 1000 MG/10ML IV SOLN
1000.0000 mg | Freq: Once | INTRAVENOUS | Status: AC
  Administered 2016-04-10: 1000 mg via INTRAVENOUS
  Filled 2016-04-10: qty 1100

## 2016-04-10 SURGICAL SUPPLY — 51 items
BAG DECANTER FOR FLEXI CONT (MISCELLANEOUS) ×2 IMPLANT
BAG ZIPLOCK 12X15 (MISCELLANEOUS) ×2 IMPLANT
BANDAGE ACE 6X5 VEL STRL LF (GAUZE/BANDAGES/DRESSINGS) ×2 IMPLANT
BANDAGE ELASTIC 6 VELCRO ST LF (GAUZE/BANDAGES/DRESSINGS) ×2 IMPLANT
BLADE SAG 18X100X1.27 (BLADE) ×2 IMPLANT
BLADE SAW SGTL 11.0X1.19X90.0M (BLADE) ×2 IMPLANT
BOWL SMART MIX CTS (DISPOSABLE) ×2 IMPLANT
CAP KNEE TOTAL 3 SIGMA ×2 IMPLANT
CEMENT HV SMART SET (Cement) ×4 IMPLANT
CLOTH BEACON ORANGE TIMEOUT ST (SAFETY) ×2 IMPLANT
CUFF TOURN SGL QUICK 34 (TOURNIQUET CUFF) ×1
CUFF TRNQT CYL 34X4X40X1 (TOURNIQUET CUFF) ×1 IMPLANT
DECANTER SPIKE VIAL GLASS SM (MISCELLANEOUS) ×2 IMPLANT
DRAPE U-SHAPE 47X51 STRL (DRAPES) ×2 IMPLANT
DRSG ADAPTIC 3X8 NADH LF (GAUZE/BANDAGES/DRESSINGS) ×2 IMPLANT
DRSG PAD ABDOMINAL 8X10 ST (GAUZE/BANDAGES/DRESSINGS) ×2 IMPLANT
DURAPREP 26ML APPLICATOR (WOUND CARE) ×2 IMPLANT
ELECT REM PT RETURN 9FT ADLT (ELECTROSURGICAL) ×2
ELECTRODE REM PT RTRN 9FT ADLT (ELECTROSURGICAL) ×1 IMPLANT
EVACUATOR 1/8 PVC DRAIN (DRAIN) ×2 IMPLANT
GAUZE SPONGE 4X4 12PLY STRL (GAUZE/BANDAGES/DRESSINGS) ×2 IMPLANT
GLOVE BIO SURGEON STRL SZ7.5 (GLOVE) IMPLANT
GLOVE BIO SURGEON STRL SZ8 (GLOVE) ×2 IMPLANT
GLOVE BIOGEL PI IND STRL 6.5 (GLOVE) IMPLANT
GLOVE BIOGEL PI IND STRL 8 (GLOVE) ×1 IMPLANT
GLOVE BIOGEL PI INDICATOR 6.5 (GLOVE)
GLOVE BIOGEL PI INDICATOR 8 (GLOVE) ×1
GLOVE SURG SS PI 6.5 STRL IVOR (GLOVE) IMPLANT
GOWN STRL REUS W/TWL LRG LVL3 (GOWN DISPOSABLE) ×2 IMPLANT
GOWN STRL REUS W/TWL XL LVL3 (GOWN DISPOSABLE) IMPLANT
HANDPIECE INTERPULSE COAX TIP (DISPOSABLE) ×1
IMMOBILIZER KNEE 20 (SOFTGOODS) ×2
IMMOBILIZER KNEE 20 THIGH 36 (SOFTGOODS) ×1 IMPLANT
MANIFOLD NEPTUNE II (INSTRUMENTS) ×2 IMPLANT
NS IRRIG 1000ML POUR BTL (IV SOLUTION) ×2 IMPLANT
PACK TOTAL KNEE CUSTOM (KITS) ×2 IMPLANT
PAD ABD 8X10 STRL (GAUZE/BANDAGES/DRESSINGS) ×2 IMPLANT
PADDING CAST COTTON 6X4 STRL (CAST SUPPLIES) ×2 IMPLANT
POSITIONER SURGICAL ARM (MISCELLANEOUS) ×2 IMPLANT
SET HNDPC FAN SPRY TIP SCT (DISPOSABLE) ×1 IMPLANT
STRIP CLOSURE SKIN 1/2X4 (GAUZE/BANDAGES/DRESSINGS) ×2 IMPLANT
SUT MNCRL AB 4-0 PS2 18 (SUTURE) ×2 IMPLANT
SUT VIC AB 2-0 CT1 27 (SUTURE) ×3
SUT VIC AB 2-0 CT1 TAPERPNT 27 (SUTURE) ×3 IMPLANT
SUT VLOC 180 0 24IN GS25 (SUTURE) ×2 IMPLANT
SYR 50ML LL SCALE MARK (SYRINGE) ×2 IMPLANT
TRAY FOLEY W/METER SILVER 16FR (SET/KITS/TRAYS/PACK) ×2 IMPLANT
WATER STERILE IRR 1000ML POUR (IV SOLUTION) ×4 IMPLANT
WATER STERILE IRR 1500ML POUR (IV SOLUTION) IMPLANT
WRAP KNEE MAXI GEL POST OP (GAUZE/BANDAGES/DRESSINGS) ×2 IMPLANT
YANKAUER SUCT BULB TIP 10FT TU (MISCELLANEOUS) ×2 IMPLANT

## 2016-04-10 NOTE — Op Note (Signed)
OPERATIVE REPORT-TOTAL KNEE ARTHROPLASTY   Pre-operative diagnosis- Osteoarthritis  Right knee(s)  Post-operative diagnosis- Osteoarthritis Right knee(s)  Procedure-  Right  Total Knee Arthroplasty  Surgeon- Gus RankinFrank V. Jaquise Faux, MD  Assistant- Dimitri PedAmber Constable, PA-C   Anesthesia-  Spinal  EBL-* No blood loss amount entered *   Drains Hemovac  Tourniquet time-  Total Tourniquet Time Documented: Thigh (Right) - 31 minutes Total: Thigh (Right) - 31 minutes     Complications- None  Condition-PACU - hemodynamically stable.   Brief Clinical Note  Jeffery Kennedy is a 81 y.o. year old male with end stage OA of his right knee with progressively worsening pain and dysfunction. He has constant pain, with activity and at rest and significant functional deficits with difficulties even with ADLs. He has had extensive non-op management including analgesics, injections of cortisone and viscosupplements, and home exercise program, but remains in significant pain with significant dysfunction. Radiographs show bone on bone arthritis medial and patellofemoral. He presents now for right Total Knee Arthroplasty.    Procedure in detail---   The patient is brought into the operating room and positioned supine on the operating table. After successful administration of  Spinal,   a tourniquet is placed high on the  Right thigh(s) and the lower extremity is prepped and draped in the usual sterile fashion. Time out is performed by the operating team and then the  Right lower extremity is wrapped in Esmarch, knee flexed and the tourniquet inflated to 300 mmHg.       A midline incision is made with a ten blade through the subcutaneous tissue to the level of the extensor mechanism. A fresh blade is used to make a medial parapatellar arthrotomy. Soft tissue over the proximal medial tibia is subperiosteally elevated to the joint line with a knife and into the semimembranosus bursa with a Cobb elevator. Soft tissue  over the proximal lateral tibia is elevated with attention being paid to avoiding the patellar tendon on the tibial tubercle. The patella is everted, knee flexed 90 degrees and the ACL and PCL are removed. Findings are bone on bone medial and patellofemoral with large global osteophytes.        The drill is used to create a starting hole in the distal femur and the canal is thoroughly irrigated with sterile saline to remove the fatty contents. The 5 degree Right  valgus alignment guide is placed into the femoral canal and the distal femoral cutting block is pinned to remove 10 mm off the distal femur. Resection is made with an oscillating saw.      The tibia is subluxed forward and the menisci are removed. The extramedullary alignment guide is placed referencing proximally at the medial aspect of the tibial tubercle and distally along the second metatarsal axis and tibial crest. The block is pinned to remove 2mm off the more deficient medial  side. Resection is made with an oscillating saw. Size 4is the most appropriate size for the tibia and the proximal tibia is prepared with the modular drill and keel punch for that size.      The femoral sizing guide is placed and size 4 is most appropriate. Rotation is marked off the epicondylar axis and confirmed by creating a rectangular flexion gap at 90 degrees. The size 4 cutting block is pinned in this rotation and the anterior, posterior and chamfer cuts are made with the oscillating saw. The intercondylar block is then placed and that cut is made.  Trial size 4 tibial component, trial size 4 posterior stabilized femur and a 10  mm posterior stabilized rotating platform insert trial is placed. Full extension is achieved with excellent varus/valgus and anterior/posterior balance throughout full range of motion. The patella is everted and thickness measured to be 24  mm. Free hand resection is taken to 12 mm, a 41 template is placed, lug holes are drilled, trial  patella is placed, and it tracks normally. Osteophytes are removed off the posterior femur with the trial in place. All trials are removed and the cut bone surfaces prepared with pulsatile lavage. Cement is mixed and once ready for implantation, the size 4 tibial implant, size  4 posterior stabilized femoral component, and the size 41 patella are cemented in place and the patella is held with the clamp. The trial insert is placed and the knee held in full extension. The Exparel (20 ml mixed with 30 ml saline) and .25% Bupivicaine, are injected into the extensor mechanism, posterior capsule, medial and lateral gutters and subcutaneous tissues.  All extruded cement is removed and once the cement is hard the permanent 10 mm posterior stabilized rotating platform insert is placed into the tibial tray.      The wound is copiously irrigated with saline solution and the extensor mechanism closed over a hemovac drain with #1 V-loc suture. The tourniquet is released for a total tourniquet time of 31  minutes. Flexion against gravity is 140 degrees and the patella tracks normally. Subcutaneous tissue is closed with 2.0 vicryl and subcuticular with running 4.0 Monocryl. The incision is cleaned and dried and steri-strips and a bulky sterile dressing are applied. The limb is placed into a knee immobilizer and the patient is awakened and transported to recovery in stable condition.      Please note that a surgical assistant was a medical necessity for this procedure in order to perform it in a safe and expeditious manner. Surgical assistant was necessary to retract the ligaments and vital neurovascular structures to prevent injury to them and also necessary for proper positioning of the limb to allow for anatomic placement of the prosthesis.   Gus RankinFrank V. Jerricka Carvey, MD    04/10/2016, 12:36 PM

## 2016-04-10 NOTE — Anesthesia Procedure Notes (Signed)
Spinal  Patient location during procedure: OR End time: 04/10/2016 11:41 AM Staffing Resident/CRNA: Noralyn Pick D Performed: anesthesiologist and resident/CRNA  Preanesthetic Checklist Completed: patient identified, site marked, surgical consent, pre-op evaluation, timeout performed, IV checked, risks and benefits discussed and monitors and equipment checked Spinal Block Patient position: sitting Prep: Betadine Patient monitoring: heart rate, continuous pulse ox and blood pressure Approach: midline Location: L3-4 Injection technique: single-shot Needle Needle type: Sprotte  Needle gauge: 24 G Needle length: 9 cm Assessment Sensory level: T6 Additional Notes Expiration date of kit checked and confirmed. Patient tolerated procedure well, without complications.

## 2016-04-10 NOTE — Anesthesia Preprocedure Evaluation (Addendum)
Anesthesia Evaluation  Patient identified by MRN, date of birth, ID band Patient awake    Airway Mallampati: I  TM Distance: >3 FB Neck ROM: Full    Dental  (+) Teeth Intact   Pulmonary neg pulmonary ROS, former smoker,    breath sounds clear to auscultation       Cardiovascular hypertension,  Rhythm:Regular Rate:Normal     Neuro/Psych    GI/Hepatic negative GI ROS, Neg liver ROS,   Endo/Other  negative endocrine ROS  Renal/GU negative Renal ROS     Musculoskeletal  (+) Arthritis ,   Abdominal   Peds  Hematology negative hematology ROS (+)   Anesthesia Other Findings   Reproductive/Obstetrics                             Anesthesia Physical Anesthesia Plan  ASA: II  Anesthesia Plan: Spinal   Post-op Pain Management:    Induction: Intravenous  Airway Management Planned: Natural Airway and Simple Face Mask  Additional Equipment:   Intra-op Plan:   Post-operative Plan:   Informed Consent:   Plan Discussed with:   Anesthesia Plan Comments:        Anesthesia Quick Evaluation

## 2016-04-10 NOTE — Discharge Instructions (Addendum)
° °Dr. Frank Aluisio °Total Joint Specialist °[facility] °[address] °, [place] [address] °[phone] ° °TOTAL KNEE REPLACEMENT POSTOPERATIVE DIRECTIONS ° °Knee Rehabilitation, Guidelines Following Surgery  °Results after knee surgery are often greatly improved when you follow the exercise, range of motion and muscle strengthening exercises prescribed by your doctor. Safety measures are also important to protect the knee from further injury. Any time any of these exercises cause you to have increased pain or swelling in your knee joint, decrease the amount until you are comfortable again and slowly increase them. If you have problems or questions, call your caregiver or physical therapist for advice.  ° °HOME CARE INSTRUCTIONS  °Remove items at home which could result in a fall. This includes throw rugs or furniture in walking pathways.  °· ICE to the affected knee every three hours for 30 minutes at a time and then as needed for pain and swelling.  Continue to use ice on the knee for pain and swelling from surgery. You may notice swelling that will progress down to the foot and ankle.  This is normal after surgery.  Elevate the leg when you are not up walking on it.   °· Continue to use the breathing machine which will help keep your temperature down.  It is common for your temperature to cycle up and down following surgery, especially at night when you are not up moving around and exerting yourself.  The breathing machine keeps your lungs expanded and your temperature down. °· Do not place pillow under knee, focus on keeping the knee straight while resting ° °DIET °You may resume your previous home diet once your are discharged from the hospital. ° °DRESSING / WOUND CARE / SHOWERING °You may shower 3 days after surgery, but keep the wounds dry during showering.  You may use an occlusive plastic wrap (Press'n Seal for example), NO SOAKING/SUBMERGING IN THE BATHTUB.  If the  bandage gets wet, change with a clean dry gauze.  If the incision gets wet, pat the wound dry with a clean towel. °You may start showering once you are discharged home but do not submerge the incision under water. Just pat the incision dry and apply a dry gauze dressing on daily. °Change the surgical dressing daily and reapply a dry dressing each time. ° °ACTIVITY °Walk with your walker as instructed. °Use walker as long as suggested by your caregivers. °Avoid periods of inactivity such as sitting longer than an hour when not asleep. This helps prevent blood clots.  °You may resume a sexual relationship in one month or when given the OK by your doctor.  °You may return to work once you are cleared by your doctor.  °Do not drive a car for 6 weeks or until released by you surgeon.  °Do not drive while taking narcotics. ° °WEIGHT BEARING °Weight bearing as tolerated with assist device (walker, cane, etc) as directed, use it as long as suggested by your surgeon or therapist, typically at least 4-6 weeks. ° °POSTOPERATIVE CONSTIPATION PROTOCOL °Constipation - defined medically as fewer than three stools per week and severe constipation as less than one stool per week. ° °One of the most common issues patients have following surgery is constipation.  Even if you have a regular bowel pattern at home, your normal regimen is likely to be disrupted due to multiple reasons following surgery.  Combination of anesthesia, postoperative narcotics, change in appetite and fluid intake all can affect your bowels.    In order to avoid complications following surgery, here are some recommendations in order to help you during your recovery period. ° °Colace (docusate) - Pick up an over-the-counter form of Colace or another stool softener and take twice a day as long as you are requiring postoperative pain medications.  Take with a full glass of water daily.  If you experience loose stools or diarrhea, hold the colace until you stool forms  back up.  If your symptoms do not get better within 1 week or if they get worse, check with your doctor. ° °Dulcolax (bisacodyl) - Pick up over-the-counter and take as directed by the product packaging as needed to assist with the movement of your bowels.  Take with a full glass of water.  Use this product as needed if not relieved by Colace only.  ° °MiraLax (polyethylene glycol) - Pick up over-the-counter to have on hand.  MiraLax is a solution that will increase the amount of water in your bowels to assist with bowel movements.  Take as directed and can mix with a glass of water, juice, soda, coffee, or tea.  Take if you go more than two days without a movement. °Do not use MiraLax more than once per day. Call your doctor if you are still constipated or irregular after using this medication for 7 days in a row. ° °If you continue to have problems with postoperative constipation, please contact the office for further assistance and recommendations.  If you experience "the worst abdominal pain ever" or develop nausea or vomiting, please contact the office immediatly for further recommendations for treatment. ° °ITCHING ° If you experience itching with your medications, try taking only a single pain pill, or even half a pain pill at a time.  You can also use Benadryl over the counter for itching or also to help with sleep.  ° °TED HOSE STOCKINGS °Wear the elastic stockings on both legs for three weeks following surgery during the day but you may remove then at night for sleeping. ° °MEDICATIONS °See your medication summary on the “After Visit Summary” that the nursing staff will review with you prior to discharge.  You may have some home medications which will be placed on hold until you complete the course of blood thinner medication.  It is important for you to complete the blood thinner medication as prescribed by your surgeon.  Continue your approved medications as instructed at time of  discharge. ° °PRECAUTIONS °If you experience chest pain or shortness of breath - call 911 immediately for transfer to the hospital emergency department.  °If you develop a fever greater that 101 F, purulent drainage from wound, increased redness or drainage from wound, foul odor from the wound/dressing, or calf pain - CONTACT YOUR SURGEON.   °                                                °FOLLOW-UP APPOINTMENTS °Make sure you keep all of your appointments after your operation with your surgeon and caregivers. You should call the office at the above phone number and make an appointment for approximately two weeks after the date of your surgery or on the date instructed by your surgeon outlined in the "After Visit Summary". ° ° °RANGE OF MOTION AND STRENGTHENING EXERCISES  °Rehabilitation of the knee is important following a knee injury or   an operation. After just a few days of immobilization, the muscles of the thigh which control the knee become weakened and shrink (atrophy). Knee exercises are designed to build up the tone and strength of the thigh muscles and to improve knee motion. Often times heat used for twenty to thirty minutes before working out will loosen up your tissues and help with improving the range of motion but do not use heat for the first two weeks following surgery. These exercises can be done on a training (exercise) mat, on the floor, on a table or on a bed. Use what ever works the best and is most comfortable for you Knee exercises include:  °Leg Lifts - While your knee is still immobilized in a splint or cast, you can do straight leg raises. Lift the leg to 60 degrees, hold for 3 sec, and slowly lower the leg. Repeat 10-20 times 2-3 times daily. Perform this exercise against resistance later as your knee gets better.  °Quad and Hamstring Sets - Tighten up the muscle on the front of the thigh (Quad) and hold for 5-10 sec. Repeat this 10-20 times hourly. Hamstring sets are done by pushing the  foot backward against an object and holding for 5-10 sec. Repeat as with quad sets.  °· Leg Slides: Lying on your back, slowly slide your foot toward your buttocks, bending your knee up off the floor (only go as far as is comfortable). Then slowly slide your foot back down until your leg is flat on the floor again. °· Angel Wings: Lying on your back spread your legs to the side as far apart as you can without causing discomfort.  °A rehabilitation program following serious knee injuries can speed recovery and prevent re-injury in the future due to weakened muscles. Contact your doctor or a physical therapist for more information on knee rehabilitation.  ° °IF YOU ARE TRANSFERRED TO A SKILLED REHAB FACILITY °If the patient is transferred to a skilled rehab facility following release from the hospital, a list of the current medications will be sent to the facility for the patient to continue.  When discharged from the skilled rehab facility, please have the facility set up the patient's Home Health Physical Therapy prior to being released. Also, the skilled facility will be responsible for providing the patient with their medications at time of release from the facility to include their pain medication, the muscle relaxants, and their blood thinner medication. If the patient is still at the rehab facility at time of the two week follow up appointment, the skilled rehab facility will also need to assist the patient in arranging follow up appointment in our office and any transportation needs. ° °MAKE SURE YOU:  °Understand these instructions.  °Get help right away if you are not doing well or get worse.  ° ° °Pick up stool softner and laxative for home use following surgery while on pain medications. °Do not submerge incision under water. °Please use good hand washing techniques while changing dressing each day. °May shower starting three days after surgery. °Please use a clean towel to pat the incision dry following  showers. °Continue to use ice for pain and swelling after surgery. °Do not use any lotions or creams on the incision until instructed by your surgeon. ° °Take Xarelto for two and a half more weeks, then discontinue Xarelto. °Once the patient has completed the blood thinner regimen, then take a Baby 81 mg Aspirin daily for three more weeks. ° ° ° °  Information on my medicine - XARELTO® (Rivaroxaban) ° °This medication education was reviewed with me or my healthcare representative as part of my discharge preparation.  ° °Why was Xarelto® prescribed for you? °Xarelto® was prescribed for you to reduce the risk of blood clots forming after orthopedic surgery. The medical term for these abnormal blood clots is venous thromboembolism (VTE). ° °What do you need to know about xarelto® ? °Take your Xarelto® ONCE DAILY at the same time every day. °You may take it either with or without food. ° °If you have difficulty swallowing the tablet whole, you may crush it and mix in applesauce just prior to taking your dose. ° °Take Xarelto® exactly as prescribed by your doctor and DO NOT stop taking Xarelto® without talking to the doctor who prescribed the medication.  Stopping without other VTE prevention medication to take the place of Xarelto® may increase your risk of developing a clot. ° °After discharge, you should have regular check-up appointments with your healthcare provider that is prescribing your Xarelto®.   ° °What do you do if you miss a dose? °If you miss a dose, take it as soon as you remember on the same day then continue your regularly scheduled once daily regimen the next day. Do not take two doses of Xarelto® on the same day.  ° °Important Safety Information °A possible side effect of Xarelto® is bleeding. You should call your healthcare provider right away if you experience any of the following: °? Bleeding from an injury or your nose that does not stop. °? Unusual colored urine (red or dark brown) or unusual  colored stools (red or black). °? Unusual bruising for unknown reasons. °? A serious fall or if you hit your head (even if there is no bleeding). ° °Some medicines may interact with Xarelto® and might increase your risk of bleeding while on Xarelto®. To help avoid this, consult your healthcare provider or pharmacist prior to using any new prescription or non-prescription medications, including herbals, vitamins, non-steroidal anti-inflammatory drugs (NSAIDs) and supplements. ° °This website has more information on Xarelto®: www.xarelto.com. ° ° °

## 2016-04-10 NOTE — Transfer of Care (Signed)
Immediate Anesthesia Transfer of Care Note  Patient: Jeffery Kennedy  Procedure(s) Performed: Procedure(s): RIGHT TOTAL KNEE ARTHROPLASTY (Right)  Patient Location: PACU  Anesthesia Type:Regional  Level of Consciousness: awake, alert  and oriented  Airway & Oxygen Therapy: Patient Spontanous Breathing and Patient connected to face mask oxygen  Post-op Assessment: Report given to RN and Post -op Vital signs reviewed and stable  Post vital signs: Reviewed and stable  Last Vitals:  Vitals:   04/10/16 0838  BP: (!) 157/76  Pulse: 63  Resp: 16  Temp: 36.7 C    Last Pain:  Vitals:   04/10/16 0838  TempSrc: Oral         Complications: No apparent anesthesia complications

## 2016-04-10 NOTE — Evaluation (Signed)
Physical Therapy Evaluation Patient Details Name: Jeffery Kennedy L Tocci MRN: [id number] DOB: 01-Aug-1934 Today's Date: 04/10/2016   History of Present Illness  s/p R TKA  Clinical Impression  Pt is s/p TKA resulting in the deficits listed below (see PT Problem List). * Pt will benefit from skilled PT to increase their independence and safety with mobility to allow discharge to the venue listed below.       Follow Up Recommendations Home health PT    Equipment Recommendations  None recommended by PT    Recommendations for Other Services       Precautions / Restrictions Precautions Precautions: Knee Precaution Comments: IND SLR --KI not used  Required Braces or Orthoses: Knee Immobilizer - Right Knee Immobilizer - Right: Discontinue once straight leg raise with < 10 degree lag Restrictions Weight Bearing Restrictions: No Other Position/Activity Restrictions: WBAT      Mobility  Bed Mobility Overal bed mobility: Needs Assistance Bed Mobility: Supine to Sit     Supine to sit: Supervision     General bed mobility comments: for safety, cues for technique  Transfers Overall transfer level: Needs assistance Equipment used: Rolling walker (2 wheeled) Transfers: Sit to/from Stand Sit to Stand: Min assist         General transfer comment: cues for hand placement and RLE position  Ambulation/Gait Ambulation/Gait assistance: Min assist;Min guard Ambulation Distance (Feet): 75 Feet Assistive device: Rolling walker (2 wheeled) Gait Pattern/deviations: Step-to pattern;Decreased weight shift to right     General Gait Details: cues for sequence and RW distance from self  Stairs            Wheelchair Mobility    Modified Rankin (Stroke Patients Only)       Balance                                             Pertinent Vitals/Pain Pain Assessment: 0-10 Pain Score: 2  Pain Location: right knee Pain Descriptors / Indicators:  Aching;Constant;Sore Pain Intervention(s): Limited activity within patient's tolerance;Monitored during session;Premedicated before session    Home Living Family/patient expects to be discharged to:: Private residence Living Arrangements: Spouse/significant other Available Help at Discharge: Family;Available PRN/intermittently;Available 24 hours/day Type of Home: House Home Access: Stairs to enter Entrance Stairs-Rails: Right Entrance Stairs-Number of Steps: 4 Home Layout: One level Home Equipment: Cane - single point;Walker - 2 wheels;Bedside commode      Prior Function Level of Independence: Independent               Hand Dominance        Extremity/Trunk Assessment   Upper Extremity Assessment: Defer to OT evaluation           Lower Extremity Assessment: RLE deficits/detail RLE Deficits / Details: knee extension and hip flexion 3/5; ankle WFL; AAROM grossly 10 to 65* knee flexion       Communication      Cognition Arousal/Alertness: Awake/alert Behavior During Therapy: WFL for tasks assessed/performed Overall Cognitive Status: Within Functional Limits for tasks assessed                      General Comments      Exercises Total Joint Exercises Ankle Circles/Pumps: AROM;Both;10 reps Quad Sets: AROM;10 reps;Both Heel Slides: AROM;Right;5 reps Straight Leg Raises: AROM;Right;5 reps   Assessment/Plan    PT Assessment Patient needs continued PT services  PT Problem List Decreased strength;Decreased range of motion;Decreased activity tolerance;Decreased mobility;Decreased knowledge of use of DME;Decreased knowledge of precautions          PT Treatment Interventions DME instruction;Gait training;Functional mobility training;Therapeutic activities;Therapeutic exercise;Patient/family education;Stair training    PT Goals (Current goals can be found in the Care Plan section)  Acute Rehab PT Goals Patient Stated Goal: home PT Goal Formulation:  With patient Time For Goal Achievement: 04/13/16 Potential to Achieve Goals: Good    Frequency 7X/week   Barriers to discharge        Co-evaluation               End of Session Equipment Utilized During Treatment: Gait belt Activity Tolerance: Patient tolerated treatment well Patient left: in chair;with call bell/phone within reach;with chair alarm set;with family/visitor present Nurse Communication: Mobility status         Time: [id number] PT Time Calculation (min) (ACUTE ONLY): 23 min   Charges:   PT Evaluation $PT Eval Low Complexity: 1 Procedure PT Treatments $Gait Training: 8-22 mins   PT G Codes:        Makinzi Prieur 04/10/2016, 5:22 PM

## 2016-04-10 NOTE — Interval H&P Note (Signed)
History and Physical Interval Note:  04/10/2016 10:39 AM  Jeffery EvaJames L Gorder  has presented today for surgery, with the diagnosis of RIGHT KNEE OA  The various methods of treatment have been discussed with the patient and family. After consideration of risks, benefits and other options for treatment, the patient has consented to  Procedure(s): RIGHT TOTAL KNEE ARTHROPLASTY (Right) as a surgical intervention .  The patient's history has been reviewed, patient examined, no change in status, stable for surgery.  I have reviewed the patient's chart and labs.  Questions were answered to the patient's satisfaction.     Loanne DrillingALUISIO,Therman Hughlett V

## 2016-04-10 NOTE — H&P (View-Only) (Signed)
Keri L Hingle DOB: 09/02/1934 Married / Language: English / Race: White Male Date of Admission:  04/10/2016 CC:  Right Knee Pain History of Present Illness  The patient is a 81 year old male who comes in for a preoperative History and Physical. The patient is scheduled for a right total knee arthroplasty to be performed by Dr. Frank V. Aluisio, MD at [facility] on 04-10-2016. The patient is a 80 year old male who presented for follow up of their knee. The patient is being followed for their right knee pain and osteoarthritis. Symptoms reported include: pain (lateral side pain), swelling, giving way and pain with weightbearing. The patient feels that they are doing poorly and report their pain level to be mild. Current treatment includes: bracing. The following medication has been used for pain control: none. Mr. Siddall states that from standpoint of pain for the most part, he is doing well. He will get occasions where he can get very sharp pains in the knee if he torques the knee. He feels as though the bad problem he is having is with function. It is getting much harder to do things he desires. He has documented end stage arthritis tricompartmental in nature in that knee. He is ready to proceed with surgery. They have been treated conservatively in the past for the above stated problem and despite conservative measures, they continue to have progressive pain and severe functional limitations and dysfunction. They have failed non-operative management including home exercise, medications, and bracing. It is felt that they would benefit from undergoing total joint replacement. Risks and benefits of the procedure have been discussed with the patient and they elect to proceed with surgery. There are no active contraindications to surgery such as ongoing infection or rapidly progressive neurological disease.  Problem List/Past Medical Primary osteoarthritis of right knee (M17.11)  Diabetes  Mellitus, Type II  Diet-Controlled Only High blood pressure  Osteoarthritis  Peripheral Neuropathy  Allergies No Known Drug Allergies   Family History Depression  Sister. Diabetes Mellitus  Brother, Father. Osteoarthritis  Mother.  Social History  Children  3 Current work status  retired Exercise  Exercises daily; does running / walking Living situation  live with spouse Marital status  married Never consumed alcohol  10/15/2015: Never consumed alcohol No history of drug/alcohol rehab  Not under pain contract  Number of flights of stairs before winded  greater than 5 Tobacco / smoke exposure  10/15/2015: no Tobacco use  Former smoker. 10/15/2015: smoke(d) 1 pack(s) per day Advance Directives  Living Will, Healthcare POA Post-Surgical Plans  Home with wife Sue Life  Medication History Lisinopril (40MG Tablet, Oral) Active. Gabapentin (300MG Capsule, Oral) Active.  Past Surgical History Cataract Surgery  bilateral Hemorrhoidectomy  Date: 1983. Inguinal Hernia Repair  open: bilateral    Review of Systems General Not Present- Chills, Fatigue, Fever, Memory Loss, Night Sweats, Weight Gain and Weight Loss. Skin Not Present- Eczema, Hives, Itching, Lesions and Rash. HEENT Not Present- Dentures, Double Vision, Headache, Hearing Loss, Tinnitus and Visual Loss. Respiratory Not Present- Allergies, Chronic Cough, Coughing up blood, Shortness of breath at rest and Shortness of breath with exertion. Cardiovascular Not Present- Chest Pain, Difficulty Breathing Lying Down, Murmur, Palpitations, Racing/skipping heartbeats and Swelling. Gastrointestinal Not Present- Abdominal Pain, Bloody Stool, Constipation, Diarrhea, Difficulty Swallowing, Heartburn, Jaundice, Loss of appetitie, Nausea and Vomiting. Male Genitourinary Not Present- Blood in Urine, Discharge, Flank Pain, Incontinence, Painful Urination, Urgency, Urinary frequency, Urinary Retention, Urinating at  Night and Weak urinary   stream. Musculoskeletal Present- Joint Pain. Not Present- Back Pain, Joint Swelling, Morning Stiffness, Muscle Pain, Muscle Weakness and Spasms. Neurological Not Present- Blackout spells, Difficulty with balance, Dizziness, Paralysis, Tremor and Weakness. Psychiatric Not Present- Insomnia.  Vitals Weight: 165 lb Height: 67in Weight was reported by patient. Height was reported by patient. Body Surface Area: 1.86 m Body Mass Index: 25.84 kg/m  Pulse: 64 (Regular)  BP: 138/80 (Sitting, Left Arm, Standard)  Physical Exam General Mental Status -Alert, cooperative and good historian. General Appearance-pleasant, Not in acute distress. Orientation-Oriented X3. Build & Nutrition-Well nourished and Well developed.  Head and Neck Head-normocephalic, atraumatic . Neck Global Assessment - supple, no bruit auscultated on the right, no bruit auscultated on the left.  Eye Pupil - Bilateral-Irregular and Regular. Motion - Bilateral-EOMI.  Chest and Lung Exam Auscultation Breath sounds - clear at anterior chest wall and clear at posterior chest wall. Adventitious sounds - No Adventitious sounds.  Cardiovascular Auscultation Rhythm - Regular rate and rhythm. Heart Sounds - S1 WNL and S2 WNL. Murmurs & Other Heart Sounds - Auscultation of the heart reveals - No Murmurs.  Abdomen Palpation/Percussion Tenderness - Abdomen is non-tender to palpation. Rigidity (guarding) - Abdomen is soft. Auscultation Auscultation of the abdomen reveals - Bowel sounds normal.  Male Genitourinary Note: Not done, not pertinent to present illness   Musculoskeletal Note: Well-developed male in no distress. His right knee shows no effusion. His range is about 5 to 130. There is moderate crepitus on range of motion, diffuse tenderness, but no instability noted.  Radiographs from last visit in the spring and he has bone on bone arthritis of all three  compartments.   Assessment & Plan Primary osteoarthritis of right knee (M17.11)  Note:Surgical Plans: Right Total Knee Replacement  Disposition: Home with wife Fannie KneeSue  PCP: Dr. Pernell DuprePetrovic - Patient has been seen preoperatively and felt to be stable for surgery.  IV TXA  Anesthesia Issues: None  Veritas Study Patient VIRTUAL THERAPY  Signed electronically by Beckey RutterAlezandrew L Perkins, III PA-C

## 2016-04-11 ENCOUNTER — Encounter (HOSPITAL_COMMUNITY): Payer: Self-pay | Admitting: Orthopedic Surgery

## 2016-04-11 LAB — CBC
HCT: 32.9 % — ABNORMAL LOW (ref 39.0–52.0)
HEMOGLOBIN: 11.2 g/dL — AB (ref 13.0–17.0)
MCH: 30.4 pg (ref 26.0–34.0)
MCHC: 34 g/dL (ref 30.0–36.0)
MCV: 89.2 fL (ref 78.0–100.0)
PLATELETS: 191 10*3/uL (ref 150–400)
RBC: 3.69 MIL/uL — AB (ref 4.22–5.81)
RDW: 12.9 % (ref 11.5–15.5)
WBC: 12.5 10*3/uL — ABNORMAL HIGH (ref 4.0–10.5)

## 2016-04-11 LAB — BASIC METABOLIC PANEL
Anion gap: 6 (ref 5–15)
BUN: 21 mg/dL — AB (ref 6–20)
CHLORIDE: 107 mmol/L (ref 101–111)
CO2: 22 mmol/L (ref 22–32)
CREATININE: 1.33 mg/dL — AB (ref 0.61–1.24)
Calcium: 8.5 mg/dL — ABNORMAL LOW (ref 8.9–10.3)
GFR calc Af Amer: 57 mL/min — ABNORMAL LOW (ref >60)
GFR calc non Af Amer: 49 mL/min — ABNORMAL LOW (ref >60)
GLUCOSE: 162 mg/dL — AB (ref 65–99)
POTASSIUM: 4.6 mmol/L (ref 3.5–5.1)
Sodium: 135 mmol/L (ref 135–145)

## 2016-04-11 MED ORDER — METHOCARBAMOL 500 MG PO TABS: 500.0000 mg | ORAL_TABLET | Freq: Four times a day (QID) | ORAL | 0 refills | Status: DC | PRN

## 2016-04-11 MED ORDER — OXYCODONE HCL 5 MG PO TABS: 5.0000 mg | ORAL_TABLET | ORAL | 0 refills | Status: DC | PRN

## 2016-04-11 MED ORDER — TRAMADOL HCL 50 MG PO TABS: 50.0000 mg | ORAL_TABLET | Freq: Four times a day (QID) | ORAL | 1 refills | Status: DC | PRN

## 2016-04-11 MED ORDER — RIVAROXABAN 10 MG PO TABS: 10.0000 mg | ORAL_TABLET | Freq: Every day | ORAL | 0 refills | Status: DC

## 2016-04-11 NOTE — Discharge Summary (Signed)
Physician Discharge Summary   Patient ID: Jeffery Kennedy MRN: [id number] DOB/AGE: 02-13-1935 81 y.o.  Admit date: 04/10/2016 Discharge date: 04-12-2016  Primary Diagnosis:  Osteoarthritis  Right knee(s)  Admission Diagnoses:  Past Medical History:  Diagnosis Date  . Arthritis   . Borderline diabetes   . Hypertension   . Neuropathy ([place])   . Pre-diabetes    Discharge Diagnoses:   Principal Problem:   OA (osteoarthritis) of knee  Estimated body mass index is 27.41 kg/m as calculated from the following:   Height as of this encounter: 5' 7"  (1.702 m).   Weight as of this encounter: 79.4 kg (175 lb).  Procedure:  Procedure(s) (LRB): RIGHT TOTAL KNEE ARTHROPLASTY (Right)   Consults: None  HPI: Jeffery Kennedy is a 81 y.o. year old male with end stage OA of his right knee with progressively worsening pain and dysfunction. He has constant pain, with activity and at rest and significant functional deficits with difficulties even with ADLs. He has had extensive non-op management including analgesics, injections of cortisone and viscosupplements, and home exercise program, but remains in significant pain with significant dysfunction. Radiographs show bone on bone arthritis medial and patellofemoral. He presents now for right Total Knee Arthroplasty.    Laboratory Data: Admission on 04/10/2016  Component Date Value Ref Range Status  . WBC 04/11/2016 12.5* 4.0 - 10.5 K/uL Final  . RBC 04/11/2016 3.69* 4.22 - 5.81 MIL/uL Final  . Hemoglobin 04/11/2016 11.2* 13.0 - 17.0 g/dL Final  . HCT 04/11/2016 32.9* 39.0 - 52.0 % Final  . MCV 04/11/2016 89.2  78.0 - 100.0 fL Final  . MCH 04/11/2016 30.4  26.0 - 34.0 pg Final  . MCHC 04/11/2016 34.0  30.0 - 36.0 g/dL Final  . RDW 04/11/2016 12.9  11.5 - 15.5 % Final  . Platelets 04/11/2016 191  150 - 400 K/uL Final  . Sodium 04/11/2016 135  135 - 145 mmol/L Final  . Potassium 04/11/2016 4.6  3.5 - 5.1 mmol/L Final  . Chloride 04/11/2016 107   101 - 111 mmol/L Final  . CO2 04/11/2016 22  22 - 32 mmol/L Final  . Glucose, Bld 04/11/2016 162* 65 - 99 mg/dL Final  . BUN 04/11/2016 21* 6 - 20 mg/dL Final  . Creatinine, Ser 04/11/2016 1.33* 0.61 - 1.24 mg/dL Final  . Calcium 04/11/2016 8.5* 8.9 - 10.3 mg/dL Final  . GFR calc non Af Amer 04/11/2016 49* >60 mL/min Final  . GFR calc Af Amer 04/11/2016 57* >60 mL/min Final   Comment: (NOTE) The eGFR has been calculated using the CKD EPI equation. This calculation has not been validated in all clinical situations. eGFR's persistently <60 mL/min signify possible Chronic Kidney Disease.   . Anion gap 04/11/2016 6  5 - 15 Final  . WBC 04/12/2016 13.7* 4.0 - 10.5 K/uL Final  . RBC 04/12/2016 3.69* 4.22 - 5.81 MIL/uL Final  . Hemoglobin 04/12/2016 11.1* 13.0 - 17.0 g/dL Final  . HCT 04/12/2016 33.1* 39.0 - 52.0 % Final  . MCV 04/12/2016 89.7  78.0 - 100.0 fL Final  . MCH 04/12/2016 30.1  26.0 - 34.0 pg Final  . MCHC 04/12/2016 33.5  30.0 - 36.0 g/dL Final  . RDW 04/12/2016 13.3  11.5 - 15.5 % Final  . Platelets 04/12/2016 188  150 - 400 K/uL Final  . Sodium 04/12/2016 136  135 - 145 mmol/L Final  . Potassium 04/12/2016 4.2  3.5 - 5.1 mmol/L Final  . Chloride 04/12/2016 105  101 - 111 mmol/L Final  . CO2 04/12/2016 25  22 - 32 mmol/L Final  . Glucose, Bld 04/12/2016 121* 65 - 99 mg/dL Final  . BUN 04/12/2016 29* 6 - 20 mg/dL Final  . Creatinine, Ser 04/12/2016 1.06  0.61 - 1.24 mg/dL Final  . Calcium 04/12/2016 8.9  8.9 - 10.3 mg/dL Final  . GFR calc non Af Amer 04/12/2016 >60  >60 mL/min Final  . GFR calc Af Amer 04/12/2016 >60  >60 mL/min Final   Comment: (NOTE) The eGFR has been calculated using the CKD EPI equation. This calculation has not been validated in all clinical situations. eGFR's persistently <60 mL/min signify possible Chronic Kidney Disease.   Georgiann Hahn gap 04/12/2016 6  5 - 15 Final  Hospital Outpatient Visit on 04/03/2016  Component Date Value Ref Range Status  .  aPTT 04/03/2016 33  24 - 36 seconds Final  . WBC 04/03/2016 6.2  4.0 - 10.5 K/uL Final  . RBC 04/03/2016 4.61  4.22 - 5.81 MIL/uL Final  . Hemoglobin 04/03/2016 13.8  13.0 - 17.0 g/dL Final  . HCT 04/03/2016 41.2  39.0 - 52.0 % Final  . MCV 04/03/2016 89.4  78.0 - 100.0 fL Final  . MCH 04/03/2016 29.9  26.0 - 34.0 pg Final  . MCHC 04/03/2016 33.5  30.0 - 36.0 g/dL Final  . RDW 04/03/2016 13.2  11.5 - 15.5 % Final  . Platelets 04/03/2016 222  150 - 400 K/uL Final  . Sodium 04/03/2016 138  135 - 145 mmol/L Final  . Potassium 04/03/2016 4.9  3.5 - 5.1 mmol/L Final  . Chloride 04/03/2016 104  101 - 111 mmol/L Final  . CO2 04/03/2016 26  22 - 32 mmol/L Final  . Glucose, Bld 04/03/2016 104* 65 - 99 mg/dL Final  . BUN 04/03/2016 27* 6 - 20 mg/dL Final  . Creatinine, Ser 04/03/2016 1.15  0.61 - 1.24 mg/dL Final  . Calcium 04/03/2016 9.3  8.9 - 10.3 mg/dL Final  . Total Protein 04/03/2016 7.5  6.5 - 8.1 g/dL Final  . Albumin 04/03/2016 4.2  3.5 - 5.0 g/dL Final  . AST 04/03/2016 33  15 - 41 U/L Final  . ALT 04/03/2016 25  17 - 63 U/L Final  . Alkaline Phosphatase 04/03/2016 65  38 - 126 U/L Final  . Total Bilirubin 04/03/2016 0.8  0.3 - 1.2 mg/dL Final  . GFR calc non Af Amer 04/03/2016 59* >60 mL/min Final  . GFR calc Af Amer 04/03/2016 >60  >60 mL/min Final   Comment: (NOTE) The eGFR has been calculated using the CKD EPI equation. This calculation has not been validated in all clinical situations. eGFR's persistently <60 mL/min signify possible Chronic Kidney Disease.   . Anion gap 04/03/2016 8  5 - 15 Final  . Prothrombin Time 04/03/2016 12.9  11.4 - 15.2 seconds Final  . INR 04/03/2016 0.98   Final  . ABO/RH(D) 04/10/2016 O POS   Final  . Antibody Screen 04/10/2016 NEG   Final  . Sample Expiration 04/10/2016 04/13/2016   Final  . Extend sample reason 04/10/2016 NO TRANSFUSIONS OR PREGNANCY IN THE PAST 3 MONTHS   Final  . MRSA, PCR 04/03/2016 NEGATIVE  NEGATIVE Final  .  Staphylococcus aureus 04/03/2016 NEGATIVE  NEGATIVE Final   Comment:        The Xpert SA Assay (FDA approved for NASAL specimens in patients over 36 years of age), is one component of a comprehensive surveillance program.  Test  performance has been validated by [facility] for patients greater than or equal to 51 year old. It is not intended to diagnose infection nor to guide or monitor treatment.   . Hgb A1c MFr Bld 04/04/2016 6.4* 4.8 - 5.6 % Final   Comment: (NOTE)         Pre-diabetes: 5.7 - 6.4         Diabetes: >6.4         Glycemic control for adults with diabetes: <7.0   . Mean Plasma Glucose 04/04/2016 137  mg/dL Final   Comment: (NOTE) Performed At: [facility] [place], [place] [id number] Lindon Romp MD VQ:[id number]   . ABO/RH(D) 04/03/2016 O POS   Final  . Color, Urine 04/03/2016 YELLOW  YELLOW Final  . APPearance 04/03/2016 CLEAR  CLEAR Final  . Specific Gravity, Urine 04/03/2016 1.022  1.005 - 1.030 Final  . pH 04/03/2016 6.5  5.0 - 8.0 Final  . Glucose, UA 04/03/2016 NEGATIVE  NEGATIVE mg/dL Final  . Hgb urine dipstick 04/03/2016 NEGATIVE  NEGATIVE Final  . Bilirubin Urine 04/03/2016 NEGATIVE  NEGATIVE Final  . Ketones, ur 04/03/2016 NEGATIVE  NEGATIVE mg/dL Final  . Protein, ur 04/03/2016 NEGATIVE  NEGATIVE mg/dL Final  . Nitrite 04/03/2016 NEGATIVE  NEGATIVE Final  . Leukocytes, UA 04/03/2016 NEGATIVE  NEGATIVE Final     X-Rays:No results found.  EKG: Orders placed or performed during the hospital encounter of 04/03/16  . EKG 12 lead  . EKG 12 lead     Hospital Course: JABER DUNLOW is a 81 y.o. who was admitted to [facility]. They were brought to the operating room on 04/10/2016 and underwent Procedure(s): RIGHT TOTAL KNEE ARTHROPLASTY.  Patient tolerated the procedure well and was later transferred to the recovery room and then to the orthopaedic floor for postoperative care.  They were given PO and IV  analgesics for pain control following their surgery.  They were given 24 hours of postoperative antibiotics of  Anti-infectives    Start     Dose/Rate Route Frequency Ordered Stop   04/10/16 1800  ceFAZolin (ANCEF) IVPB 2g/100 mL premix     2 g 200 mL/hr over 30 Minutes Intravenous Every 6 hours 04/10/16 1401 04/11/16 0007   04/10/16 0839  ceFAZolin (ANCEF) IVPB 2g/100 mL premix     2 g 200 mL/hr over 30 Minutes Intravenous On call to O.R. 04/10/16 0034 04/10/16 1143     and started on DVT prophylaxis in the form of Xarelto.   PT and OT were ordered for total joint protocol.  Discharge planning consulted to help with postop disposition and equipment needs.  Patient had a good night on the evening of surgery.  They started to get up OOB with therapy on day one. Hemovac drain was pulled without difficulty.  Dressing was checked and  was clean and dry. Patient was seen in rounds and was setup to go home but needed more therapy. He was seen on day two, met goals, and then went home.   Diet: Cardiac diet and Diabetic diet Activity:WBAT Follow-up:in 2 weeks Disposition - Home Discharged Condition: good   Discharge Instructions    Call MD / Call 911    Complete by:  As directed    If you experience chest pain or shortness of breath, CALL 911 and be transported to the hospital emergency room.  If you develope a fever above 101 F, pus (white drainage) or increased drainage or redness  at the wound, or calf pain, call your surgeon's office.   Change dressing    Complete by:  As directed    Change dressing daily with sterile 4 x 4 inch gauze dressing and apply TED hose. Do not submerge the incision under water.   Constipation Prevention    Complete by:  As directed    Drink plenty of fluids.  Prune juice may be helpful.  You may use a stool softener, such as Colace (over the counter) 100 mg twice a day.  Use MiraLax (over the counter) for constipation as needed.   Diet - low sodium heart healthy     Complete by:  As directed    Diet Carb Modified    Complete by:  As directed    Discharge instructions    Complete by:  As directed    Pick up stool softner and laxative for home use following surgery while on pain medications. Do not submerge incision under water. Please use good hand washing techniques while changing dressing each day. May shower starting three days after surgery. Please use a clean towel to pat the incision dry following showers. Continue to use ice for pain and swelling after surgery. Do not use any lotions or creams on the incision until instructed by your surgeon.   Postoperative Constipation Protocol  Constipation - defined medically as fewer than three stools per week and severe constipation as less than one stool per week.  One of the most common issues patients have following surgery is constipation.  Even if you have a regular bowel pattern at home, your normal regimen is likely to be disrupted due to multiple reasons following surgery.  Combination of anesthesia, postoperative narcotics, change in appetite and fluid intake all can affect your bowels.  In order to avoid complications following surgery, here are some recommendations in order to help you during your recovery period.  Colace (docusate) - Pick up an over-the-counter form of Colace or another stool softener and take twice a day as long as you are requiring postoperative pain medications.  Take with a full glass of water daily.  If you experience loose stools or diarrhea, hold the colace until you stool forms back up.  If your symptoms do not get better within 1 week or if they get worse, check with your doctor.  Dulcolax (bisacodyl) - Pick up over-the-counter and take as directed by the product packaging as needed to assist with the movement of your bowels.  Take with a full glass of water.  Use this product as needed if not relieved by Colace only.   MiraLax (polyethylene glycol) - Pick up  over-the-counter to have on hand.  MiraLax is a solution that will increase the amount of water in your bowels to assist with bowel movements.  Take as directed and can mix with a glass of water, juice, soda, coffee, or tea.  Take if you go more than two days without a movement. Do not use MiraLax more than once per day. Call your doctor if you are still constipated or irregular after using this medication for 7 days in a row.  If you continue to have problems with postoperative constipation, please contact the office for further assistance and recommendations.  If you experience "the worst abdominal pain ever" or develop nausea or vomiting, please contact the office immediatly for further recommendations for treatment.   Take Xarelto for two and a half more weeks, then discontinue Xarelto. Once the patient has completed  the Xarelto, they may resume the 81 mg Aspirin.   Do not put a pillow under the knee. Place it under the heel.    Complete by:  As directed    Do not sit on low chairs, stoools or toilet seats, as it may be difficult to get up from low surfaces    Complete by:  As directed    Driving restrictions    Complete by:  As directed    No driving until released by the physician.   Increase activity slowly as tolerated    Complete by:  As directed    Lifting restrictions    Complete by:  As directed    No lifting until released by the physician.   Patient may shower    Complete by:  As directed    You may shower without a dressing once there is no drainage.  Do not wash over the wound.  If drainage remains, do not shower until drainage stops.   TED hose    Complete by:  As directed    Use stockings (TED hose) for 3 weeks on both leg(s).  You may remove them at night for sleeping.   Weight bearing as tolerated    Complete by:  As directed    Laterality:  right   Extremity:  Lower     Med Rec must be completed prior to using this [place], MD. Schedule an appointment as soon as possible for a visit on 04/25/2016.   Specialty:  Orthopedic Surgery Contact information: [address] [place] [address] [phone]           Signed: Arlee Muslim, PA-C Orthopaedic Surgery 04/12/2016, 9:09 AM

## 2016-04-11 NOTE — Progress Notes (Signed)
Physical Therapy Treatment Patient Details Name: Jonette EvaJames L Armetta MRN: [id number] DOB: May 20, 1935 Today's Date: 04/11/2016    History of Present Illness s/p R TKA    PT Comments    Progressing well  Follow Up Recommendations  Home health PT     Equipment Recommendations  None recommended by PT    Recommendations for Other Services       Precautions / Restrictions Precautions Precautions: Knee Precaution Comments: IND SLR --KI not used  Required Braces or Orthoses: Knee Immobilizer - Right Knee Immobilizer - Right: Discontinue once straight leg raise with < 10 degree lag Restrictions Weight Bearing Restrictions: No Other Position/Activity Restrictions: WBAT    Mobility  Bed Mobility Overal bed mobility: Modified Independent                Transfers Overall transfer level: Needs assistance Equipment used: Rolling walker (2 wheeled) Transfers: Sit to/from Stand Sit to Stand: Min guard;Supervision         General transfer comment: cues for hand placement and RLE position, overall safety  Ambulation/Gait Ambulation/Gait assistance: Min guard Ambulation Distance (Feet): 110 Feet Assistive device: Rolling walker (2 wheeled) Gait Pattern/deviations: Step-through pattern;Antalgic     General Gait Details: cues for sequence and RW distance from self   Stairs            Wheelchair Mobility    Modified Rankin (Stroke Patients Only)       Balance                                    Cognition Arousal/Alertness: Awake/alert Behavior During Therapy: WFL for tasks assessed/performed Overall Cognitive Status: Within Functional Limits for tasks assessed                      Exercises Total Joint Exercises Ankle Circles/Pumps: AROM;Both;10 reps Quad Sets: AROM;10 reps;Both Short Arc Quad: Strengthening;AROM;Right;10 reps Heel Slides: AROM;Right;5 reps Hip ABduction/ADduction: AROM;Strengthening;Right;10 reps Straight Leg  Raises: AROM;Strengthening;Right;15 reps Goniometric ROM: ~-10 to 80*    General Comments        Pertinent Vitals/Pain Pain Assessment: 0-10 Pain Score: 7  Pain Location: R knee during ex Pain Descriptors / Indicators: Discomfort;Grimacing Pain Intervention(s): Limited activity within patient's tolerance;Monitored during session;Ice applied    Home Living                      Prior Function            PT Goals (current goals can now be found in the care plan section) Acute Rehab PT Goals Patient Stated Goal: home PT Goal Formulation: With patient Time For Goal Achievement: 04/13/16 Potential to Achieve Goals: Good Progress towards PT goals: Progressing toward goals    Frequency    7X/week      PT Plan Current plan remains appropriate    Co-evaluation             End of Session Equipment Utilized During Treatment: Gait belt Activity Tolerance: Patient tolerated treatment well Patient left: in chair;with call bell/phone within reach;with chair alarm set;with family/visitor present     Time: 0920-0951 PT Time Calculation (min) (ACUTE ONLY): 31 min  Charges:  $Gait Training: 8-22 mins $Therapeutic Exercise: 8-22 mins                    G Codes:      Danton Palmateer 04/11/2016, 10:01  AM

## 2016-04-11 NOTE — Progress Notes (Signed)
   04/11/16 1400  PT Visit Information  Last PT Received On 04/11/16  Assistance Needed +1  History of Present Illness s/p R TKA  Subjective Data  Patient Stated Goal home  Precautions  Precautions Knee  Precaution Comments IND SLR --KI not used   Restrictions  Other Position/Activity Restrictions WBAT  Pain Assessment  Pain Assessment 0-10  Pain Score 2  Pain Location R knee  Pain Descriptors / Indicators Sore  Pain Intervention(s) Limited activity within patient's tolerance;Monitored during session;Repositioned  Cognition  Arousal/Alertness Awake/alert  Behavior During Therapy WFL for tasks assessed/performed  Overall Cognitive Status Within Functional Limits for tasks assessed  Bed Mobility  Overal bed mobility Modified Independent  Transfers  Overall transfer level Needs assistance  Equipment used Rolling walker (2 wheeled)  Transfers Sit to/from Stand  Sit to Stand Supervision  General transfer comment cues for hand placement and RLE position  Ambulation/Gait  Ambulation/Gait assistance Min guard  Ambulation Distance (Feet) 170 Feet  Assistive device Rolling walker (2 wheeled)  Gait Pattern/deviations Step-through pattern;Step-to pattern;Decreased weight shift to right  General Gait Details cues for sequence and RW distance from self, safety with turns  Total Joint Exercises  Quad Sets AROM;Both;5 reps  Heel Slides AROM;Right;5 reps  PT - End of Session  Activity Tolerance Patient tolerated treatment well  Patient left in bed;with call bell/phone within reach;with family/visitor present  Nurse Communication Mobility status  PT - Assessment/Plan  PT Plan Current plan remains appropriate  PT Frequency (ACUTE ONLY) 7X/week  Follow Up Recommendations Home health PT  PT equipment None recommended by PT  PT Goal Progression  Progress towards PT goals Progressing toward goals  Acute Rehab PT Goals  PT Goal Formulation With patient  Time For Goal Achievement 04/13/16   PT Time Calculation  PT Start Time (ACUTE ONLY) 1341  PT Stop Time (ACUTE ONLY) 1355  PT Time Calculation (min) (ACUTE ONLY) 14 min  PT General Charges  $$ ACUTE PT VISIT 1 Procedure  PT Treatments  $Gait Training 8-22 mins

## 2016-04-11 NOTE — Anesthesia Postprocedure Evaluation (Signed)
Anesthesia Post Note  Patient: Jeffery Kennedy  Procedure(s) Performed: Procedure(s) (LRB): RIGHT TOTAL KNEE ARTHROPLASTY (Right)  Patient location during evaluation: PACU Anesthesia Type: Spinal Level of consciousness: oriented and awake and alert Pain management: pain level controlled Vital Signs Assessment: post-procedure vital signs reviewed and stable Respiratory status: spontaneous breathing, respiratory function stable and patient connected to nasal cannula oxygen Cardiovascular status: blood pressure returned to baseline and stable Postop Assessment: no headache and no backache Anesthetic complications: no    Last Vitals:  Vitals:   04/10/16 1703 04/10/16 2055  BP: (!) 128/58 131/66  Pulse: 64 71  Resp: 15 16  Temp: 36.6 C 36.7 C    Last Pain:  Vitals:   04/10/16 2055  TempSrc: Oral  PainSc: 3                  Dameian Crisman,Cully TERRILL

## 2016-04-11 NOTE — Progress Notes (Addendum)
   Subjective: 1 Day Post-Op Procedure(s) (LRB): RIGHT TOTAL KNEE ARTHROPLASTY (Right) Patient reports pain as mild.   Patient seen in rounds with Dr. Aluisio. Patient is Jeffery Kennedy, but has had some minor complaints of pain in the knee, requiring pain medications We will start therapy today.  Plan is to go Home after hospital stay.  Objective: Vital signs in last 24 hours: Temp:  [97.6 F (36.4 C)-97.9 F (36.6 C)] 97.9 F (36.6 C) (11/21 1406) Pulse Rate:  [66-69] 69 (11/21 1406) Resp:  [16] 16 (11/21 1406) BP: (126-149)/(61-62) 149/61 (11/21 1406) SpO2:  [96 %] 96 % (11/21 1406)  Intake/Output from previous day:  Intake/Output Summary (Last 24 hours) at 04/11/16 2131 Last data filed at 04/11/16 2110  Gross per 24 hour  Intake          1818.33 ml  Output             2035 ml  Net          -216.67 ml    Intake/Output this shift: Total I/O In: -  Out: 175 [Urine:175]  Labs:  Recent Labs  04/11/16 0420  HGB 11.2*    Recent Labs  04/11/16 0420  WBC 12.5*  RBC 3.69*  HCT 32.9*  PLT 191    Recent Labs  04/11/16 0420  NA 135  K 4.6  CL 107  CO2 22  BUN 21*  CREATININE 1.33*  GLUCOSE 162*  CALCIUM 8.5*   No results for input(s): LABPT, INR in the last 72 hours.  EXAM General - Patient is Alert, Appropriate and Oriented Extremity - Neurovascular intact Sensation intact distally Intact pulses distally Dorsiflexion/Plantar flexion intact Dressing - dressing C/D/I Motor Function - intact, moving foot and toes well on exam.  Hemovac pulled without difficulty.  Past Medical History:  Diagnosis Date  . Arthritis   . Borderline diabetes   . Hypertension   . Neuropathy (HCC)   . Pre-diabetes     Assessment/Plan: 1 Day Post-Op Procedure(s) (LRB): RIGHT TOTAL KNEE ARTHROPLASTY (Right) Principal Problem:   OA (osteoarthritis) of knee  Estimated body mass index is 27.41 kg/m as calculated from the following:   Height as of this encounter: 5\' 7"   (1.702 m).   Weight as of this encounter: 79.4 kg (175 lb). Advance diet Up with therapy Plan for discharge tomorrow Discharge home - No HHPT - Virtual Therapy at home  DVT Prophylaxis - Xarelto Weight-Bearing as tolerated to right leg D/C O2 and Pulse OX and try on Room Air  Avel Peacerew Danielys Madry, PA-C Orthopaedic Surgery 04/11/2016, 9:31 PM

## 2016-04-11 NOTE — Progress Notes (Signed)
Spoke with patient at bedside. States he and attending were planning on virtual PT at home, his wife will assist at d/c, he has the setup ready and has been practicing, he has all his DME at home already. No further needs assessed.

## 2016-04-11 NOTE — Progress Notes (Signed)
Occupational Therapy Evaluation Patient Details Name: Jeffery Kennedy MRN: [id number] DOB: 06/13/1934 Today's Date: 04/11/2016    History of Present Illness s/p R TKA   Clinical Impression   This 81 year old man was admitted for the above sx. All education was completed. No further OT is needed at this time    Follow Up Recommendations  No OT follow up    Equipment Recommendations  None recommended by OT    Recommendations for Other Services       Precautions / Restrictions Precautions Precautions: Knee Precaution Comments: IND SLR --KI not used  Required Braces or Orthoses: Knee Immobilizer - Right Knee Immobilizer - Right: Discontinue once straight leg raise with < 10 degree lag Restrictions Weight Bearing Restrictions: No Other Position/Activity Restrictions: WBAT      Mobility Bed Mobility Overal bed mobility: Modified Independent             General bed mobility comments: oob  Transfers Overall transfer level: Needs assistance Equipment used: Rolling walker (2 wheeled) Transfers: Sit to/from Stand Sit to Stand: Min guard;Supervision         General transfer comment: cues for UE placement    Balance                                            ADL Overall ADL's : Needs assistance/impaired     Grooming: Supervision/safety;Standing                   Toilet Transfer: Min guard;Ambulation;BSC;RW       Tub/ Shower Transfer: Walk-in shower;Min guard;Ambulation;3 in 1     General ADL Comments: ambulated to bathroom to practice bathroom transfers:  simulated ledge as his hospital room's is accessible.  Wife will assist with adls as needed.  Educated on sidestepping through tight spaces and knee precautions     Vision     Perception     Praxis      Pertinent Vitals/Pain Pain Assessment: 0-10 Pain Score: 5  Pain Location: R knee Pain Descriptors / Indicators: Aching Pain Intervention(s): Limited activity  within patient's tolerance;Monitored during session;Premedicated before session;Repositioned;Ice applied     Hand Dominance     Extremity/Trunk Assessment Upper Extremity Assessment Upper Extremity Assessment: Overall WFL for tasks assessed           Communication Communication Communication: No difficulties   Cognition Arousal/Alertness: Awake/alert Behavior During Therapy: WFL for tasks assessed/performed Overall Cognitive Status: Within Functional Limits for tasks assessed                     General Comments       Exercises       Shoulder Instructions      Home Living Family/patient expects to be discharged to:: Private residence Living Arrangements: Spouse/significant other Available Help at Discharge: Family;Available PRN/intermittently;Available 24 hours/day               Bathroom Shower/Tub: Producer, television/film/videoWalk-in shower   Bathroom Toilet: Standard     Home Equipment: Cane - single point;Walker - 2 wheels;Bedside commode          Prior Functioning/Environment Level of Independence: Independent                 OT Problem List:     OT Treatment/Interventions:      OT Goals(Current goals can be found  in the care plan section) Acute Rehab OT Goals Patient Stated Goal: home OT Goal Formulation: All assessment and education complete, DC therapy  OT Frequency:     Barriers to D/C:            Co-evaluation              End of Session    Activity Tolerance: Patient tolerated treatment well Patient left: in chair;with call bell/phone within reach;with chair alarm set;with family/visitor present   Time: [id number] OT Time Calculation (min): 16 min Charges:  OT General Charges $OT Visit: 1 Procedure OT Evaluation $OT Eval Low Complexity: 1 Procedure G-Codes:    Jeffery Kennedy 04/11/2016, 12:29 PM Jeffery Kennedy, OTR/L [phone] 04/11/2016

## 2016-04-12 LAB — CBC
HEMATOCRIT: 33.1 % — AB (ref 39.0–52.0)
Hemoglobin: 11.1 g/dL — ABNORMAL LOW (ref 13.0–17.0)
MCH: 30.1 pg (ref 26.0–34.0)
MCHC: 33.5 g/dL (ref 30.0–36.0)
MCV: 89.7 fL (ref 78.0–100.0)
PLATELETS: 188 10*3/uL (ref 150–400)
RBC: 3.69 MIL/uL — ABNORMAL LOW (ref 4.22–5.81)
RDW: 13.3 % (ref 11.5–15.5)
WBC: 13.7 10*3/uL — ABNORMAL HIGH (ref 4.0–10.5)

## 2016-04-12 LAB — BASIC METABOLIC PANEL
Anion gap: 6 (ref 5–15)
BUN: 29 mg/dL — AB (ref 6–20)
CALCIUM: 8.9 mg/dL (ref 8.9–10.3)
CO2: 25 mmol/L (ref 22–32)
CREATININE: 1.06 mg/dL (ref 0.61–1.24)
Chloride: 105 mmol/L (ref 101–111)
GFR calc Af Amer: >60 mL/min (ref >60)
GFR calc non Af Amer: >60 mL/min (ref >60)
GLUCOSE: 121 mg/dL — AB (ref 65–99)
Potassium: 4.2 mmol/L (ref 3.5–5.1)
Sodium: 136 mmol/L (ref 135–145)

## 2016-04-12 MED ORDER — OXYCODONE HCL 5 MG PO TABS: 5.0000 mg | ORAL_TABLET | ORAL | 0 refills | Status: AC | PRN

## 2016-04-12 MED ORDER — CYCLOBENZAPRINE HCL 5 MG PO TABS: 5.0000 mg | ORAL_TABLET | Freq: Three times a day (TID) | ORAL | Status: DC | PRN

## 2016-04-12 MED ORDER — TRAMADOL HCL 50 MG PO TABS: 50.0000 mg | ORAL_TABLET | Freq: Four times a day (QID) | ORAL | 1 refills | Status: AC | PRN

## 2016-04-12 MED ORDER — RIVAROXABAN 10 MG PO TABS: 10.0000 mg | ORAL_TABLET | Freq: Every day | ORAL | 0 refills | Status: AC

## 2016-04-12 MED ORDER — CYCLOBENZAPRINE HCL 5 MG PO TABS: 5.0000 mg | ORAL_TABLET | Freq: Three times a day (TID) | ORAL | 0 refills | Status: AC | PRN

## 2016-04-12 NOTE — Progress Notes (Signed)
Physical Therapy Treatment Patient Details Name: Jeffery Kennedy MRN: [id number] DOB: 1935-03-11 Today's Date: 04/12/2016    History of Present Illness s/p R TKA    PT Comments    Pt progressing well with mobility, increased gait distance today. Reviewed HEP, stair training completed. Pt ready to DC home from PT standpoint.   Follow Up Recommendations  Other (comment) (virtual PT)     Equipment Recommendations  None recommended by PT    Recommendations for Other Services       Precautions / Restrictions Precautions Precautions: Knee Precaution Comments: IND SLR --KI not used  Restrictions Weight Bearing Restrictions: No Other Position/Activity Restrictions: WBAT    Mobility  Bed Mobility Overal bed mobility: Modified Independent Bed Mobility: Supine to Sit     Supine to sit: Modified independent (Device/Increase time);HOB elevated        Transfers Overall transfer level: Needs assistance Equipment used: Rolling walker (2 wheeled) Transfers: Sit to/from Stand Sit to Stand: Supervision         General transfer comment: cues for hand placement and RLE position  Ambulation/Gait Ambulation/Gait assistance: Supervision Ambulation Distance (Feet): 300 Feet Assistive device: Rolling walker (2 wheeled) Gait Pattern/deviations: Step-through pattern   Gait velocity interpretation: at or above normal speed for age/gender General Gait Details: VCs to lift head and for heel strike and knee flexion with swing phase   Stairs Stairs: Yes Stairs assistance: Supervision Stair Management: One rail Right;Step to pattern;Forwards;With crutches Number of Stairs: 3 General stair comments: VCs for sequencing  Wheelchair Mobility    Modified Rankin (Stroke Patients Only)       Balance Overall balance assessment: Modified Independent                                  Cognition Arousal/Alertness: Awake/alert Behavior During Therapy: WFL for tasks  assessed/performed Overall Cognitive Status: Within Functional Limits for tasks assessed                      Exercises Total Joint Exercises Ankle Circles/Pumps: AROM;Both;10 reps Quad Sets: AROM;10 reps;Both Short Arc Quad: Strengthening;AROM;Right;10 reps Heel Slides: AROM;Right;10 reps;AAROM Straight Leg Raises: AROM;Strengthening;Right;10 reps Long Arc Quad: AROM;Right;10 reps;Seated Knee Flexion: AAROM;AROM;Right;10 reps;Seated (assisted L knee flexion with RLE) Goniometric ROM: ~10-80* AAROM     General Comments        Pertinent Vitals/Pain Pain Score: 6  Pain Location: R knee walking Pain Descriptors / Indicators: Sore;Aching Pain Intervention(s): Monitored during session;Limited activity within patient's tolerance;RN gave pain meds during session;Ice applied    Home Living                      Prior Function            PT Goals (current goals can now be found in the care plan section) Acute Rehab PT Goals Patient Stated Goal: home PT Goal Formulation: With patient Time For Goal Achievement: 04/13/16 Potential to Achieve Goals: Good Progress towards PT goals: Progressing toward goals    Frequency    7X/week      PT Plan Current plan remains appropriate    Co-evaluation             End of Session Equipment Utilized During Treatment: Gait belt Activity Tolerance: Patient tolerated treatment well Patient left: with call bell/phone within reach;in chair     Time: [id number] PT Time Calculation (min) (ACUTE ONLY):  34 min  Charges:  $Gait Training: 8-22 mins $Therapeutic Exercise: 8-22 mins                    G Codes:      Tamala SerUhlenberg, Siren Porrata Kistler 04/12/2016, 8:46 AM [phone]

## 2017-03-09 IMAGING — DX DG CHEST 2V
2 series · 2 of 2 positions shown · non-contrast
Comparison: None.

CLINICAL DATA: Preoperative chest radiograph.  Initial encounter.

EXAM:
CHEST  2 VIEW

[chest lat]
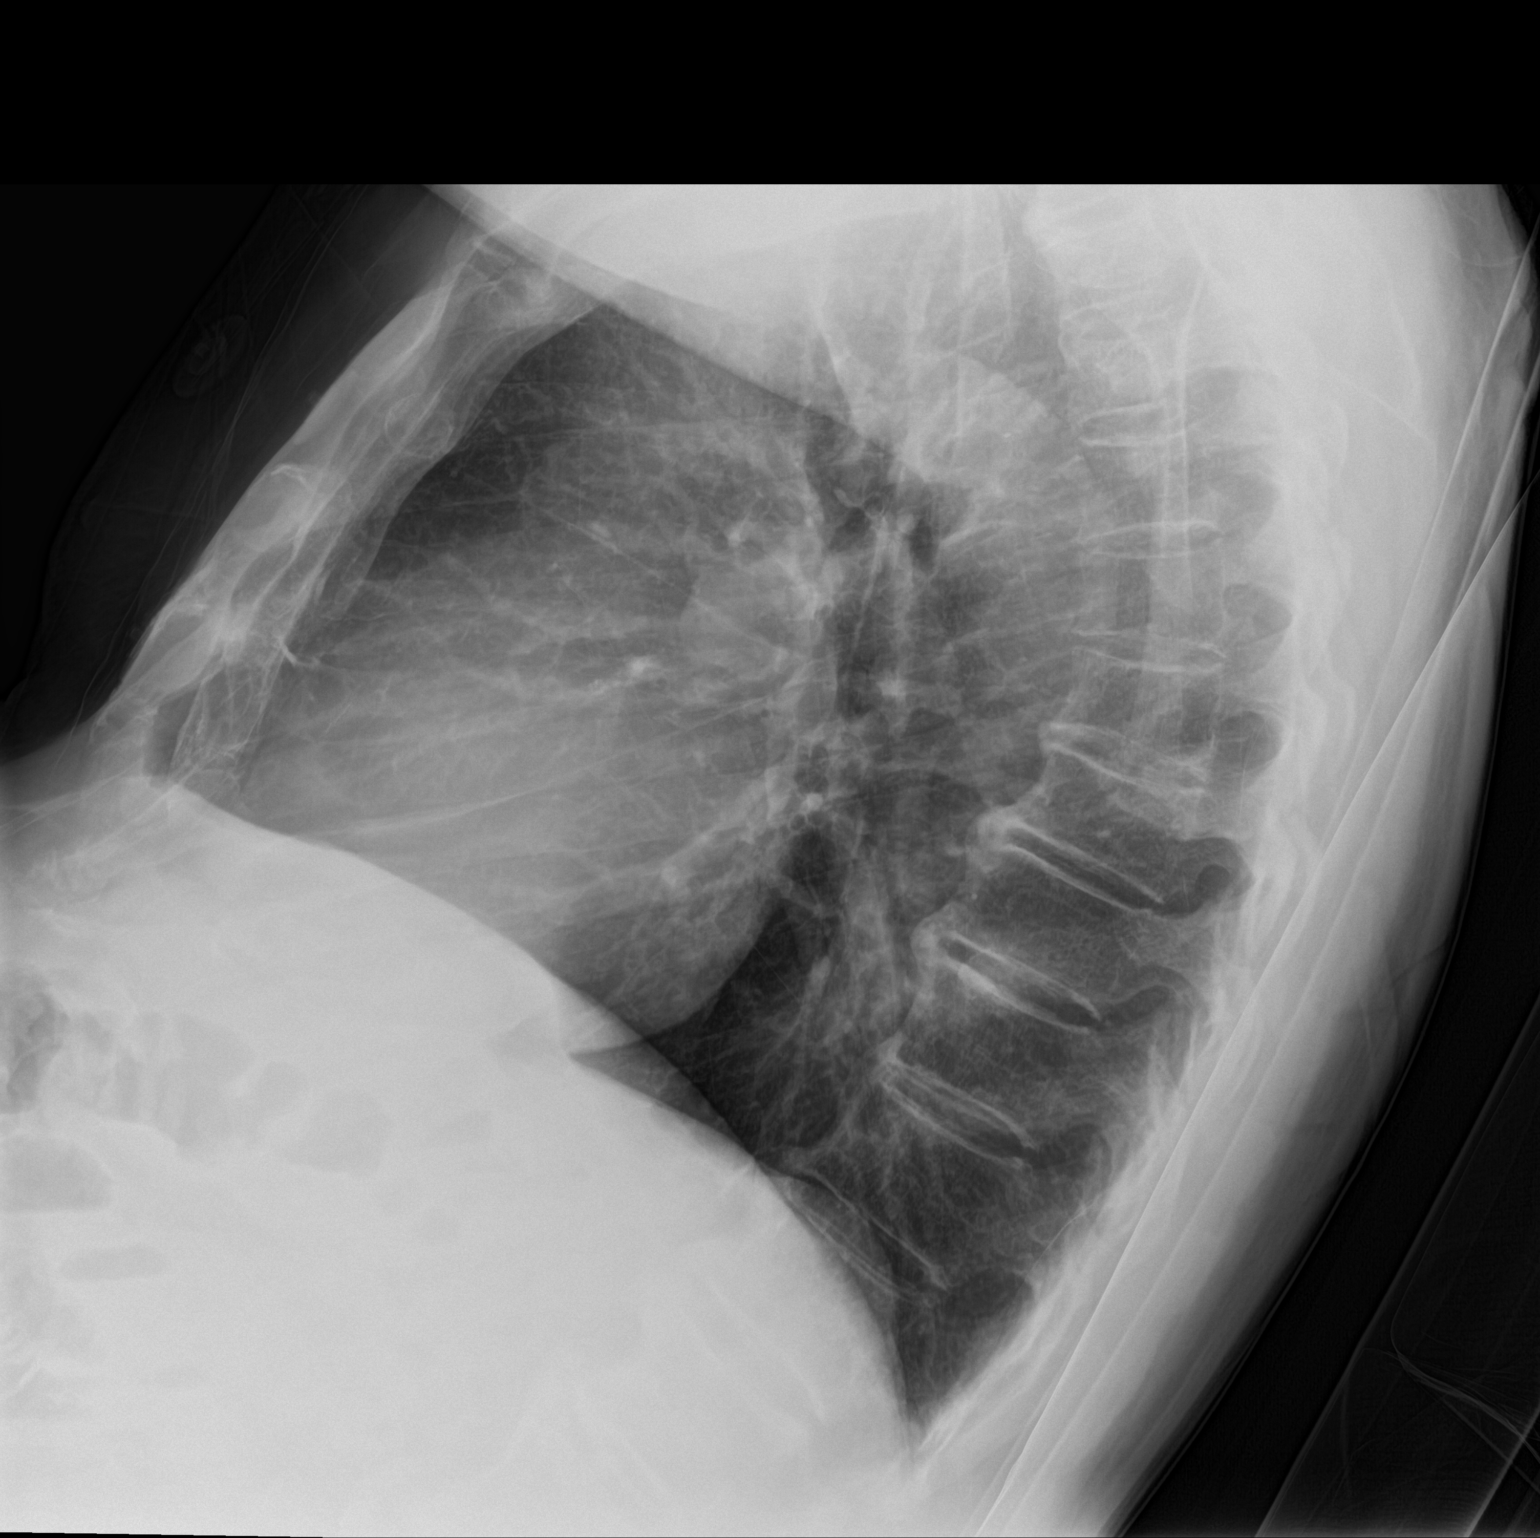

[chest ap]
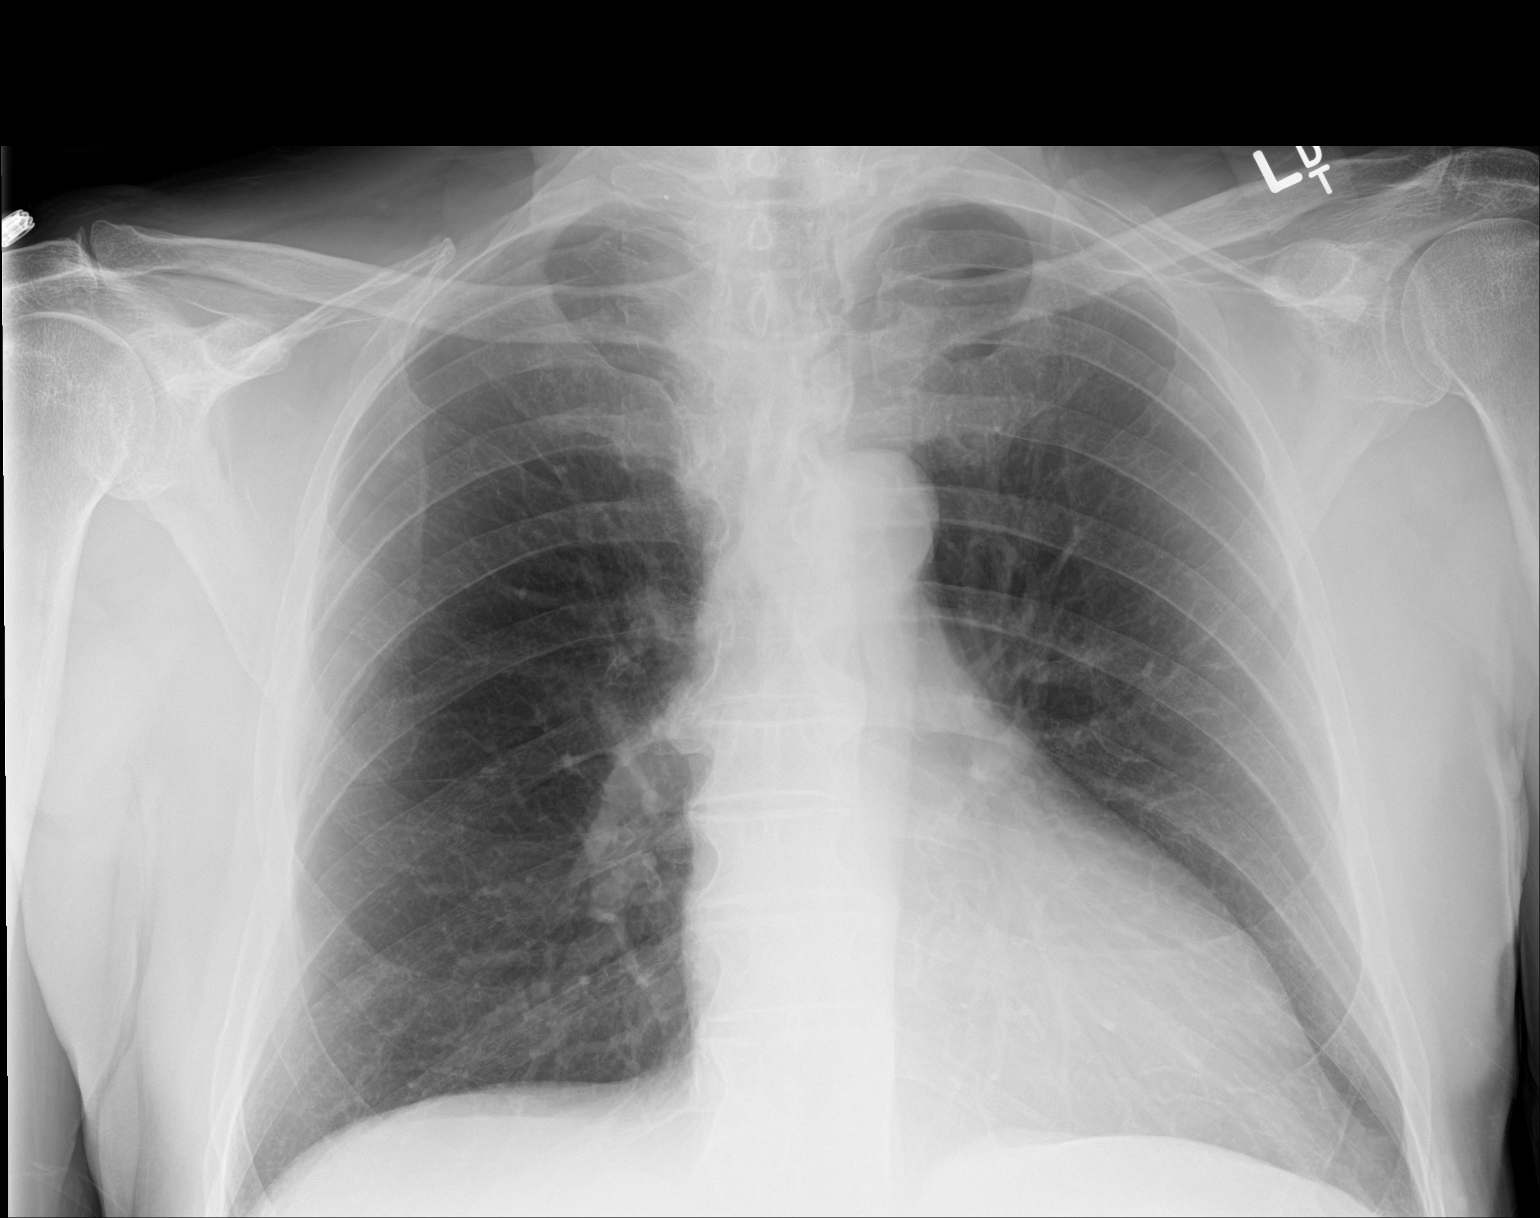

[2 of 2 positions shown; findings below may reference images not displayed]

FINDINGS: The lungs are well-aerated and clear. There is no evidence of focal
opacification, pleural effusion or pneumothorax.

The heart is normal in size; the mediastinal contour is within
normal limits. No acute osseous abnormalities are seen.
IMPRESSION: No acute cardiopulmonary process seen.

## 2019-04-24 ENCOUNTER — Other Ambulatory Visit: Payer: Self-pay

## 2019-04-24 DIAGNOSIS — Z20822 Contact with and (suspected) exposure to covid-19: Secondary | ICD-10-CM

## 2019-04-27 LAB — NOVEL CORONAVIRUS, NAA: SARS-CoV-2, NAA: NOT DETECTED

## 2022-02-15 NOTE — Progress Notes (Signed)
 I had the pleasure of seeing Jeffery Kennedy, a 87 y.o. male who presents today for evaluation of their voice and/or swallowing at [facility] at [facility].   HPI:   Jeffery Kennedy is a 87 y.o. male who presents today for evaluation of dysphonia and dysphagia that began suddenly without any inciting event. Is accompanied by daughter today from whom history was also obtained.  Endorses vocal fatigue, strain, effortful phonation that is never normal. Much worse with use. Has average vocal demands. 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 Also with dysphagia - regurgitation, solid food sticking sensation in throat. No choking on liquids has significant coughing with solid foods. No previous swallowing workup.    QUALITY OF LIFE Higher score = greater negative impact on QOL   QUALITY OF LIFE 02/15/2022  Voice Handicap Index (VHI-10): score / 40 12  Reflux Severity Index (RSI): score / 45 9  Laryngopharyngeal Universal Measure of Perceived Sensation (LUMP): score / 32 1  Dyspnea Index (DI): score / 40 0  Cough Severity Index (CSI): score / 40 0  Eating Assessment Tool (EAT): score / 40 6      REVIEW OF SYSTEMS: I have reviewed the patient reported information the intake form on today's visit.  The patient completed pertinent review of systems (which was scanned into their medical record).  Otherwise the review of systems was negative.   Past Medical History:  Diagnosis Date  . Acid reflux   . Crush injury to finger    right long and ring finger  . Hypertension   . Laceration of extensor muscle, fascia and tendon of right ring finger at wrist and hand level, subsequent encounter 04/07/2015  . Laceration of nail bed of finger    right long finger  . Laceration of radial artery    right  . Neuropathy   . Open displaced fracture of middle phalanx of right ring finger 04/07/2015   Past Surgical History:  Procedure Laterality  Date  . EXTENSOR TENDON REPAIR HAND Right    ring finger  . FINGER AMPUTATION     right small finger  . HEMORRHOID SURGERY    . HERNIA REPAIR Bilateral   . ORIF FINGER / THUMB FRACTURE Right 03/12/2015   ring finger open middle phalanx fracture  . OTHER SURGICAL HISTORY     repair of right ring finger radial digital artery uncer microscope  . OTHER SURGICAL HISTORY Right 03/12/2015   right long finger repair of laceration and nail bed laceration  . REPLACEMENT TOTAL KNEE Right    Family History  Problem Relation Age of Onset  . Diabetes Father   . Prostate cancer Brother   . Bladder cancer Brother   . Bladder cancer Brother   . Lung cancer Brother    Social History   Tobacco Use  . Smoking status: Former    Packs/day: 0.50    Years: 40.00    Additional pack years: 0.00    Total pack years: 20.00    Types: Cigarettes    Quit date: 35    Years since quitting: 29.7  . Smokeless tobacco: Never  . Tobacco comments:    quit 22 yrs ago  Substance Use Topics  . Alcohol use: Not Currently   No Known Allergies Current Outpatient Medications  Medication Sig Dispense Refill  . cyanocobalamin (VITAMIN B12) 1000 MCG tablet Take 0.5 tablets (500 mcg total) by mouth twice a  week.    . DULoxetine  (CYMBALTA ) 30 MG DR capsule Take 1 capsule (30 mg total) by mouth daily. 90 capsule 3  . lisinopriL  (PRINIVIL ,ZESTRIL ) 40 MG tablet Take 1 tablet (40 mg total) by mouth every morning. For blood pressure 90 tablet 3  . metoPROLOL  succinate (TOPROL -XL) 50 MG 24 hr tablet Take 1 tablet (50 mg total) by mouth daily. For blood pressure. 90 tablet 3  . omeprazole  (PRILOSEC) 20 MG capsule Take 1 capsule (20 mg total) by mouth daily. 90 capsule 3  . vitamins A, C, E with zinc & copper (ICAPS AREDS) [id number] unit-mg-unit Cap Take 1 capsule by mouth daily.    . DULoxetine  (CYMBALTA ) 20 MG DR capsule Take 1 capsule (20 mg total) by mouth daily.    . lisinopriL  (PRINIVIL ,ZESTRIL ) 40 MG tablet  Take 1 tablet (40 mg total) by mouth every morning.    . omeprazole  (PRILOSEC) 20 MG DR capsule Take 1 capsule (20 mg total) by mouth daily.    SABRA syringe with needle (BD ECLIPSE LUER-LOK) 3 mL 23 x 1 Syrg Use as directed for Vitamin B 12 injections 50 each 0   No current facility-administered medications for this visit.    PHYSICAL EXAM:  VS: Blood pressure 180/90, pulse 48, height 1.702 m (5' 7), weight 71.2 kg (157 lb), SpO2 99 %. Constitutional: Pleasant, well-developed, well-nourished patient in no apparent distress. Mental status is normal, patient able to communicate.  Breathing quietly, comfortably, no stridor or wheezing. Vital signs - see nursing note. Voice quality very strained, asthenic.  Head and Face: No skin lesions, face symmetric Skin: No suspicious lesions on the facial or neck skin, no rashes or evidence of infectious skin process Ears: External ears normal to inspection and palpation, canals clear, TMs intact, no middle effusion.  Nose: Anterior rhinoscopy reveals no masses or lesions. Nasal mucosa appears healthy.  Oral Cavity/Oropharynx: No masses or lesions of lips, gums, tongue, floor of mouth, buccal mucosa, tonsils, hard palate or soft palate.   No erythema, exudate, or tonsillar masses.  Posterior pharyngeal wall is normal.  Larynx and nasopharynx examined by scope (see procedure note). Neck/Lymphatic: No neck masses or lesions palpable.  Thyroid is midline without fullness or nodules.  Supple. Respiratory: No audible stridor, no shortness of air, normal rate.    PROCEDURE:   Video Laryngostroboscopy (VLS) Report (CPT [phone])  A transnasal video laryngostroboscopy (VLS) was performed to closely evaluate the patient's laryngeal biomechanics and vocal fold oscillation.   VLS Exam Detail:  Amplitude - Left: increased Amplitude - Right: increased  Mucosal Wave - Left: normal Mucosal Wave - Right: normal  Vocal Fold Motion - Left: normal Vocal Fold Motion - Right:  normal  Vertical Level of Approximation: equal Glottic Closure: elliptical  Phase Symmetry: symmetric Periodicity (Regularity): regular  Supraglottic Hyperfunction: Mild mixed Nasopharynx:  Bilateral tori wnl, no masses or lesions  Airway: Widely Patent Additional Observations: Age appropriate vocal fold atrophy   Exam Summary: Examiner: Lyndsay L. Delayne, DO Diagnostician: Mirna L. Delayne, DO  Endoscope Used:  Pentax distal chip flexible laryngoscope [id number]  Exam Type: Transnasal videolaryngoscopy  Anesthesia: Yes, Topical (oxymetazoline and 4% lidocaine ) Sensitivity to Scope: no   Summary of Findings: The videostroboscopic exam revealed bilateral, age-appropriate, vocal fold atrophy    ASSESSMENT:   Mr. Jeffery Kennedy is a 87 y.o. male with   1. Hoarseness      2. Dysphagia, unspecified type      3. 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 Muscle tension dysphonia         PLAN: Today, I discussed issues and options today.  The risks, benefits and alternatives were discussed and questions answered.   I have reviewed the patient's medical record from their referring provider and discussed the findings with the patient.  I have recommended: Medical speech evaluation  I have ordered the following:  Barium Esophagram to further assess esophageal function. This is an x-ray swallowing test that will grossly assess the motility of the esophagus, look for strictures or other narrowed areas, assess the lower esophageal sphincter function, etc. Sometimes gastroesophageal reflux/regurgitation can be identified on exam. I will personally review this exam and make recommendations for next best steps in management.  I have recommended the patient undergo: Voice Therapy:   Voice therapy involves a patient-centered treatment method to modify behaviors that contribute to voice disorders or in some other way limit normal voice use. Vocal behaviors causing hoarseness  are changed in two major ways:  (1) rigorous application of vocal wellness principles and (2) a series of therapeutic techniques superficially designed to change the way the vocal folds vibrate and vocal tract resonates.  Both aspects, vocal wellness and voice exercises, are important to the success of the voice therapy.  Vocal wellness involves reducing both environmental and behavioral factors that may damage the voice. For example, smoking, excessive alcohol intake, exposure to fumes and chemicals, and frequent shouting are some activities that may damage the vocal folds. Drinking adequate water to lubricate body tissues, regular exercise, and a well-balanced diet are positive practices of vocal hygiene and are vital to a healthy voice.   Voice therapy also involves a series of techniques/exercises designed to address the specific problems associated with the voice disorder. Voice therapy exercises include postural adjustments, breathing, specific neck and throat relaxation exercises, flow phonation, resonant voice, and exercises to mobilize the muscles of the lips, tongue and jaw. Exercises to bring the vocal folds together with decreased or increased force are also an integral part of voice therapy. Paramount to every voice therapy exercise is the ability to hear and feel changes in voice production. The purpose of this training is to help a person develop an awareness of the new voice and to differentiate the "new" from the "old" voice. In this way the patient becomes comfortable with the "new" voice and is able to adjust the voice with little effort. Ultimately, this modified voice quality is integrated into conversational speech in an automatic and consistent manner, without conscious use of the voice therapy techniques.  Voice therapy, like physical therapy, relies on the patient as the most important participant in returning the voice to its highest functional level. The voice therapist directs the  program but it is only successful If the patient is motivated to change, understands the changes to be made, and is compliant with the voice therapy program (e.g., practices exercises) until the desired results are achieved.  Voice therapy sessions should take place once a week for the first few weeks in order to establish the behavioral changes needed to correct the voice disorder.  Common voice disorders such as vocal nodule or muscle tension dysphonia are generally treated in 3-6 sessions, with a very high success rate. In the beginning, the new voice may feel different or "unusual" to the patient, as is true of many new activities (new shoes, new golf swing.)  However, with the combined efforts of the  voice therapist and the patient, improved voice quality and function can be successfully achieved. These successes are then integrated into speaking technique without having to consciously think about voice therapy technique.   I had a long discussion with the patient today regarding the findings of vocal fold atrophy on laryngeal examination. We did discuss the difficulty in treating vocal fold atropy, due to the loss of vocal fold muscle bulk or tone or a combination of these. We did discuss potential treatment options including voice therapy, trial temporary vocal fold injection, or observation alone. The patient is also aware that due to the difficulty in treating this complex vocal disorder, use of any of these treatments or even combination therapy may not result in significant voice improvement.    I have asked the patient to follow up on same day as swallow study.     Electronically signed by: Delayne Mirna Massa, DO 02/15/22 1709

## 2022-04-19 ENCOUNTER — Encounter (HOSPITAL_BASED_OUTPATIENT_CLINIC_OR_DEPARTMENT_OTHER): Payer: Self-pay | Admitting: Emergency Medicine

## 2022-04-19 ENCOUNTER — Emergency Department (HOSPITAL_BASED_OUTPATIENT_CLINIC_OR_DEPARTMENT_OTHER): Payer: Medicare HMO

## 2022-04-19 ENCOUNTER — Emergency Department (HOSPITAL_BASED_OUTPATIENT_CLINIC_OR_DEPARTMENT_OTHER)
Admission: EM | Admit: 2022-04-19 | Discharge: 2022-04-19 | Disposition: A | Discharge status: Home / Self Care | Payer: Medicare HMO | Attending: Emergency Medicine | Admitting: Emergency Medicine

## 2022-04-19 ENCOUNTER — Emergency Department (HOSPITAL_BASED_OUTPATIENT_CLINIC_OR_DEPARTMENT_OTHER): Payer: Medicare HMO | Admitting: Radiology

## 2022-04-19 ENCOUNTER — Other Ambulatory Visit: Payer: Self-pay

## 2022-04-19 DIAGNOSIS — I10 Essential (primary) hypertension: Secondary | ICD-10-CM | POA: Insufficient documentation

## 2022-04-19 DIAGNOSIS — S76811A Strain of other specified muscles, fascia and tendons at thigh level, right thigh, initial encounter: Secondary | ICD-10-CM | POA: Insufficient documentation

## 2022-04-19 DIAGNOSIS — W010XXA Fall on same level from slipping, tripping and stumbling without subsequent striking against object, initial encounter: Secondary | ICD-10-CM | POA: Diagnosis not present

## 2022-04-19 DIAGNOSIS — S0101XA Laceration without foreign body of scalp, initial encounter: Secondary | ICD-10-CM | POA: Diagnosis not present

## 2022-04-19 DIAGNOSIS — Z7901 Long term (current) use of anticoagulants: Secondary | ICD-10-CM | POA: Insufficient documentation

## 2022-04-19 DIAGNOSIS — R030 Elevated blood-pressure reading, without diagnosis of hypertension: Secondary | ICD-10-CM

## 2022-04-19 DIAGNOSIS — W19XXXA Unspecified fall, initial encounter: Secondary | ICD-10-CM

## 2022-04-19 DIAGNOSIS — S9031XA Contusion of right foot, initial encounter: Secondary | ICD-10-CM | POA: Diagnosis not present

## 2022-04-19 DIAGNOSIS — Z79899 Other long term (current) drug therapy: Secondary | ICD-10-CM | POA: Diagnosis not present

## 2022-04-19 DIAGNOSIS — S76311A Strain of muscle, fascia and tendon of the posterior muscle group at thigh level, right thigh, initial encounter: Secondary | ICD-10-CM

## 2022-04-19 DIAGNOSIS — S0990XA Unspecified injury of head, initial encounter: Secondary | ICD-10-CM | POA: Diagnosis present

## 2022-04-19 MED ORDER — TETANUS-DIPHTH-ACELL PERTUSSIS 5-2.5-18.5 LF-MCG/0.5 IM SUSY: 0.5000 mL | PREFILLED_SYRINGE | Freq: Once | INTRAMUSCULAR | Status: DC

## 2022-04-19 MED ORDER — LIDOCAINE-EPINEPHRINE (PF) 2 %-1:200000 IJ SOLN
INTRAMUSCULAR | Status: AC
  Filled 2022-04-19: qty 20

## 2022-04-19 MED ORDER — LIDOCAINE-EPINEPHRINE-TETRACAINE (LET) TOPICAL GEL
3.0000 mL | Freq: Once | TOPICAL | Status: AC
  Administered 2022-04-19: 3 mL via TOPICAL
  Filled 2022-04-19: qty 3

## 2022-04-19 NOTE — ED Notes (Signed)
Discharge paperwork given and verbally understood. 

## 2022-04-19 NOTE — ED Provider Notes (Signed)
[facility] EMERGENCY DEPT Provider Note   CSN: [id number] Arrival date & time: 04/19/22  1608     History  Chief Complaint  Patient presents with   Jeffery Kennedy is a 87 y.o. male [place] emergency department with a chief complaint of fall.  He is attended by his family member.  Patient reports a mechanical fall.  He tripped and fell hitting the front of his head onto the concrete.  He did not lose consciousness.  He denies any neck pain.  He complains of pain in the right hamstring area and states he is unable to bear weight on the leg.  He also injured his right foot.  He does not take any blood thinners.  He has a history of hypertension and took his medications but states it is always "high."   Fall       Home Medications Prior to Admission medications   Medication Sig Start Date End Date Taking? Authorizing Provider  cyclobenzaprine (FLEXERIL) 5 MG tablet Take 1 tablet (5 mg total) by mouth 3 (three) times daily as needed for muscle spasms. 04/12/16   Perkins, Alexzandrew L, PA-C  gabapentin (NEURONTIN) 300 MG capsule TAKE 1 CAPSULE BY MOUTH 2 TIMES DAILY. 01/19/15   [provider]  lisinopril (PRINIVIL,ZESTRIL) 40 MG tablet Take 40 mg by mouth every morning. 02/15/15   [provider]  oxyCODONE (OXY IR/ROXICODONE) 5 MG immediate release tablet Take 1-2 tablets (5-10 mg total) by mouth every 3 (three) hours as needed for moderate pain or severe pain. 04/12/16   Perkins, Alexzandrew L, PA-C  rivaroxaban (XARELTO) 10 MG TABS tablet Take 1 tablet (10 mg total) by mouth daily with breakfast. Take Xarelto for two and a half more weeks, then discontinue Xarelto. Once the patient has completed the blood thinner regimen, then take a Baby 81 mg Aspirin daily for three more weeks. 04/12/16   Perkins, Alexzandrew L, PA-C  traMADol (ULTRAM) 50 MG tablet Take 1-2 tablets (50-100 mg total) by mouth every 6 (six) hours as needed for moderate pain.  04/12/16   Perkins, Alexzandrew L, PA-C      Allergies    Patient has no known allergies.    Review of Systems   Review of Systems  Physical Exam Updated Vital Signs BP (!) 181/81   Pulse (!) 54   Temp 97.9 F (36.6 C) (Oral)   Resp 16   SpO2 100%  Physical Exam Vitals and nursing note reviewed.  Constitutional:      General: He is not in acute distress.    Appearance: He is well-developed. He is not diaphoretic.  HENT:     Head: Normocephalic.     Comments: Shin to the frontal, no active bleeding Eyes:     General: No scleral icterus.    Conjunctiva/sclera: Conjunctivae normal.  Cardiovascular:     Rate and Rhythm: Normal rate and regular rhythm.     Heart sounds: Normal heart sounds.  Pulmonary:     Effort: Pulmonary effort is normal. No respiratory distress.     Breath sounds: Normal breath sounds.  Abdominal:     Palpations: Abdomen is soft.     Tenderness: There is no abdominal tenderness.  Musculoskeletal:     Cervical back: Normal range of motion and neck supple.     Comments: Bruising to to the right foot no significant tenderness, full range of motion No obvious injuries or deformity to the right leg.  Patient is tender  to palpation at the proximal portion of the right hamstring.  He has normal strength with flexion extension of the hip.  Minimal tenderness with internal/external rotation of the hip joint.  Skin:    General: Skin is warm and dry.  Neurological:     General: No focal deficit present.     Mental Status: He is alert.  Psychiatric:        Behavior: Behavior normal.     ED Results / Procedures / Treatments   Labs (all labs ordered are listed, but only abnormal results are displayed) Labs Reviewed - No data to display  EKG None  Radiology CT Head Wo Contrast  Result Date: 04/19/2022 CLINICAL DATA:  Fall with forehead laceration EXAM: CT HEAD WITHOUT CONTRAST CT CERVICAL SPINE WITHOUT CONTRAST TECHNIQUE: Multidetector CT imaging of the  head and cervical spine was performed following the standard protocol without intravenous contrast. Multiplanar CT image reconstructions of the cervical spine were also generated. RADIATION DOSE REDUCTION: This exam was performed according to the departmental dose-optimization program which includes automated exposure control, adjustment of the mA and/or kV according to patient size and/or use of iterative reconstruction technique. COMPARISON:  None Available. FINDINGS: CT HEAD FINDINGS Brain: There is no mass, hemorrhage or extra-axial collection. The size and configuration of the ventricles and extra-axial CSF spaces are normal. There is hypoattenuation of the periventricular white matter, most commonly indicating chronic ischemic microangiopathy. There are old bilateral cerebellar infarcts. Vascular: No abnormal hyperdensity of the major intracranial arteries or dural venous sinuses. No intracranial atherosclerosis. Skull: The visualized skull base, calvarium and extracranial soft tissues are normal. Sinuses/Orbits: No fluid levels or advanced mucosal thickening of the visualized paranasal sinuses. No mastoid or middle ear effusion. The orbits are normal. CT CERVICAL SPINE FINDINGS Alignment: No static subluxation. Facets are aligned. Occipital condyles are normally positioned. Skull base and vertebrae: No acute fracture. Soft tissues and spinal canal: No prevertebral fluid or swelling. No visible canal hematoma. Disc levels: Multilevel severe facet arthrosis Upper chest: No pneumothorax, pulmonary nodule or pleural effusion. Other: Normal visualized paraspinal cervical soft tissues. IMPRESSION: 1. No acute intracranial abnormality. 2. 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 3. No acute fracture or static subluxation of the cervical spine. Electronically Signed   By: Deatra RobinsonKevin  Herman M.D.   On: 04/19/2022 19:05   CT Cervical Spine Wo Contrast  Result Date: 04/19/2022 CLINICAL  DATA:  Fall with forehead laceration EXAM: CT HEAD WITHOUT CONTRAST CT CERVICAL SPINE WITHOUT CONTRAST TECHNIQUE: Multidetector CT imaging of the head and cervical spine was performed following the standard protocol without intravenous contrast. Multiplanar CT image reconstructions of the cervical spine were also generated. RADIATION DOSE REDUCTION: This exam was performed according to the departmental dose-optimization program which includes automated exposure control, adjustment of the mA and/or kV according to patient size and/or use of iterative reconstruction technique. COMPARISON:  None Available. FINDINGS: CT HEAD FINDINGS Brain: There is no mass, hemorrhage or extra-axial collection. The size and configuration of the ventricles and extra-axial CSF spaces are normal. There is hypoattenuation of the periventricular white matter, most commonly indicating chronic ischemic microangiopathy. There are old bilateral cerebellar infarcts. Vascular: No abnormal hyperdensity of the major intracranial arteries or dural venous sinuses. No intracranial atherosclerosis. Skull: The visualized skull base, calvarium and extracranial soft tissues are normal. Sinuses/Orbits: No fluid levels or advanced mucosal thickening of the visualized paranasal sinuses. No mastoid or middle ear effusion. The orbits are normal. CT CERVICAL SPINE FINDINGS Alignment: No  static subluxation. Facets are aligned. Occipital condyles are normally positioned. Skull base and vertebrae: No acute fracture. Soft tissues and spinal canal: No prevertebral fluid or swelling. No visible canal hematoma. Disc levels: Multilevel severe facet arthrosis Upper chest: No pneumothorax, pulmonary nodule or pleural effusion. Other: Normal visualized paraspinal cervical soft tissues. IMPRESSION: 1. No acute intracranial abnormality. 2. 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 3. No acute fracture or static subluxation of the cervical  spine. Electronically Signed   By: Deatra Robinson M.D.   On: 04/19/2022 19:05   DG Foot Complete Right  Result Date: 04/19/2022 CLINICAL DATA:  Fall, right foot pain EXAM: RIGHT FOOT COMPLETE - 3+ VIEW COMPARISON:  None Available. FINDINGS: There is no evidence of fracture or dislocation. Small plantar calcaneal spur. There is no evidence of arthropathy or other focal bone abnormality. Soft tissues are unremarkable. IMPRESSION: 1. Negative. Electronically Signed   By: Helyn Numbers M.D.   On: 04/19/2022 18:48   DG Hip Unilat W or Wo Pelvis 2-3 Views Right  Result Date: 04/19/2022 CLINICAL DATA:  Fall, right hip pain EXAM: DG HIP (WITH OR WITHOUT PELVIS) 2-3V RIGHT COMPARISON:  None Available. FINDINGS: Normal alignment. No acute fracture or dislocation. 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 Left hip joint space preserved. Degenerative changes are seen within the lower lumbar spine, not well profiled on this examination. Vascular calcifications noted. IMPRESSION: 1. No acute fracture or dislocation. Electronically Signed   By: Helyn Numbers M.D.   On: 04/19/2022 18:47    Procedures .Marland KitchenLaceration Repair  Date/Time: 04/19/2022 7:39 PM  Performed by: Arthor Captain, PA-C Authorized by: Arthor Captain, PA-C   Consent:    Consent obtained:  Verbal   Consent given by:  Patient   Risks discussed:  Infection, pain and poor cosmetic result Anesthesia:    Anesthesia method:  Topical application   Topical anesthetic:  LET Laceration details:    Location:  Scalp   Scalp location:  Frontal   Length (cm):  3 Pre-procedure details:    Preparation:  Patient was prepped and draped in usual sterile fashion Exploration:    Wound exploration: wound explored through full range of motion and entire depth of wound visualized     Wound extent: no signs of injury and no vascular damage   Treatment:    Area cleansed with:  Chlorhexidine   Amount of cleaning:  Standard    Irrigation solution:  Sterile saline   Irrigation method:  Syringe Skin repair:    Repair method:  Tissue adhesive Approximation:    Approximation:  Close Repair type:    Repair type:  Simple Post-procedure details:    Dressing:  Open (no dressing)     Medications Ordered in ED Medications  lidocaine-EPINEPHrine-tetracaine (LET) topical gel (3 mLs Topical Given 04/19/22 1816)    ED Course/ Medical Decision Making/ A&P                           Medical Decision Making 87 y/o male with fall, head lac, leg and foot pain, The emergent differential diagnosis for trauma is extensive and requires complex medical decision making. The differential includes, but is not limited to traumatic brain injury, Orbital trauma, maxillofacial trauma, skull fracture, blunt/penetrating neck trauma, vertebral artery dissection, whiplash, cervical fracture, neurogenic shock, spinal cord injury, thoracic trauma (blunt/penetrating) cardiac trauma, thoracic and lumbar spine trauma. Abdominal trauma (blunt. Penetrating), genitourinary trauma, extremity fractures, skin lacerations/ abrasions, vascular injuries.  Amount and/or Complexity of Data Reviewed Radiology: ordered and independent interpretation performed.    Details: I visualized and interpreted all images. No acute findings.   Patient has has no acute findings. He is ambulatory without assistance but has a limp. I offered crutches,however the patient has a walker at home. He declines pain medication He has good social support and appears appropriate for discharge.        Final Clinical Impression(s) / ED Diagnoses Final diagnoses:  Fall, initial encounter  Hamstring strain, right, initial encounter  Laceration of scalp, initial encounter  Elevated blood pressure reading    Rx / DC Orders ED Discharge Orders     None         Arthor Captain, PA-C 04/20/22 1959    Mardene Sayer, MD 04/21/22 [phone]

## 2022-04-19 NOTE — ED Notes (Signed)
Pt given a urinal.

## 2022-04-19 NOTE — Discharge Instructions (Signed)
Do not scratch, rub, or pick at the adhesive. Leave tissue adhesive in place. It will come off naturally after 7-10 days. Do not place tape over the adhesive. The adhesive could come off the wound when you pull the tape off. Protect the wound from further injury until it is healed. Check your wound area every day for signs of infection. Check for: More redness, swelling, or pain,Fluid or blood,Warmth, Pus or a bad smell. Do not take baths, swim, or use a hot tub until your health care provider approves. You may only be allowed to take sponge baths. Ask your health care provider if you may take showers.You can usually shower after the first 24 hours. Cover the dressing with a watertight covering when you take a shower. Do not soak the area where there is tissue adhesive. Do not use any soaps, petroleum jelly products, or ointments on the wound. Certain ointments can weaken the adhesive.  

## 2022-04-19 NOTE — ED Triage Notes (Signed)
Fall tripped over curb. Happened around 3pm  C/o pain in right legf right shoulder and lac to forehead. No blood thinners, no asa. Gcs 15, no loc

## 2022-04-20 NOTE — Progress Notes (Signed)
 Ambulatory Care Navigator ED Transitions of Care Note  Date of ED Visit: 04/19/2022 Date of Week: Wednesday Time of visit: 16:08 Facility Name: [facility] Facility Name(Other): [facility] , Time of Visit : 16:08:00 Diagnosis: Elevated blood-pressure reading, without diagnosis of hypertension, Laceration without foreign body of scalp, initial encounter, Unspecified fall, initial encounter, Strain of muscle, fascia and tendon of the posterior muscle group at thigh level, right thigh, initial encounter  Facility Type: ED Discharge   First Call: Successful First Call Details: Other First Call Date: 04/20/2022 First Call Notes: #[phone]  Reason for ED visit: Pt fell and hit his head.    Prior to leaving the ED, your doctor discussed your diagnosis with you, can you please explain your understanding?: Yes Please explain: Pts dtr Mliss reports they did x rays and CT scan, no fractures, glued the cut on his forehead.  Are you aware of signs or symptoms for a worsening condition?: Yes Please explain: HN discussed N/V, dizziness, lightheadedness  Since you have arrived home, do you feel your condition has improved, stayed the same or worsened?: Improved Please explain: Mliss reports that patient was doing ok when they got home last night, she is away today on a mission trip and pt is at home with caregiver that comes daily for his wife and then her brother will be there this evening to help them get ready for bed. She has not spoken with pt this morning but will call to check in on him. She reports pt has had increased falls recently, states that he is very active and will not stop, states that he does not use an assistive device, has usually fallen in grass or on dirt without injuries but this fall happened on concrete. Has not had any complaints of dizziness, she reports his BP has been high usually when he goes to the doctors office or in the ED  (mentions white coat syndrome) sometimes systolic high as 799, she reports will start taking BP readings at home and take to PCP appt. She reports he is taking his BP meds as directed.    Were you prescribed any new medications while in the ED?: No Do you have any questions about your medications?: No Did you discuss any of the following? (Multiselect): Provide assistance with PCP or Specialist referral Notes: HN offered to assist with PCP follow up, Mliss reports that she will call the office to schedule.       Avoided Occurence?: No  Have you fallen within the last 6 months?: Yes Please explain: Mliss reports pt has had increased in falls about 3x a week.  Do you use a cane, walker, or crutches, or have to hold onto things when you walk?: No Do you have difficulty with balance or feel unsteady when you are walking?: Yes Do you ever feel dizzy when going from sitting to standing or lying to sitting?: No Did you answer yes to any of the previous 4 fall risk questions?: Yes   Does the patient require an SDOH Assessment?: No   PCP Appointment scheduled?: Yes Date of PCP appointment: 05/05/2022      Rosaline Quale RN [facility] Health Navigator [phone]     Electronically signed by: Rosaline Sheryle Quale, RN 04/20/2022 12:03 PM     Electronically signed by: Quale Rosaline Sheryle, RN 04/20/22 1223

## 2022-05-05 NOTE — Progress Notes (Signed)
 [facility] [place]  Return Visit Jeffery Kennedy DOB: 1934-06-23  MRN: [id number] Visit Date: 05/05/2022  Encounter Provider: Laneta Tanda Lynwood, PA-C  Subjective:   Jeffery Kennedy is a 87 y.o. male presenting for hospital follow-up after a fall. -He was seen 04/19/2022 at [facility] emergency department after tripping and falling.  He hit the front of his head on concrete but did not lose consciousness.  He did not experience any neck pain but had pain in his right hamstring area and reported inability to bear weight on the leg.  He also reported right foot pain.  He is not on any blood thinners.  He reports taking his hypertension medications prior to ER visit and states his blood pressure stays high.  It was 181/81. CT head and spine w/o contrast showed no acute fracture, intracranial abnormalities.  Right foot x-ray showed negative for fracture.  Hip x-ray showed no acute fracture or dislocation.  The laceration on his scalp was closed with tissue adhesive.  He was offered crutches and pain medication in the ER prior to discharge but declined both. -He seen in the office today, 05/05/2022 for follow-up.  - Foot pain is from a plowfalling on the foot a few days before the fall. States that the fourth toe has remained swollen, but it's improved. Reports some pain over third and fourth toes. No evidence of fracture on x-ray. - Hamstring is still sore but loosening up some. Has been alternating ice and heat. Has taken some Tylenol  for the first couple of days. Reports doing well. - States he has a lot of white coat hypertension and states his BP normally stabilizes during his visit. BP 158/98 on initial check in office, and my personal recheck with manual cuff reads 132/82.  Review of Systems  Constitutional: Negative for chills and fever.  Eyes: Negative for blurred vision, double vision and pain.  Respiratory: Positive for cough. Negative for shortness of  breath.        Cough related to postnasal drip versus reflux. chronic and no changes   Cardiovascular: Negative for chest pain, palpitations and leg swelling.  Musculoskeletal: Positive for falls and myalgias. Negative for back pain, joint pain and neck pain.       Single fall on 11/29 Muscle pain in right hamstring only  Skin: Negative for itching and rash.  Neurological: Negative for dizziness, tingling, sensory change, speech change, focal weakness, seizures, loss of consciousness, weakness and headaches.    Objective:  BP 132/82   Pulse 58   Temp 97.9 F (36.6 C)   Resp 14   Wt 75.8 kg (167 lb 3.2 oz)   SpO2 99%   BMI 26.19 kg/m   Physical Exam Vitals and nursing note reviewed.  Constitutional:      General: He is not in acute distress.    Appearance: Normal appearance. He is normal weight. He is not ill-appearing, toxic-appearing or diaphoretic.  HENT:     Head: Normocephalic and atraumatic.     Right Ear: External ear normal.     Left Ear: External ear normal.     Nose: Nose normal.  Eyes:     General: No scleral icterus.       Right eye: No discharge.        Left eye: No discharge.     Extraocular Movements: Extraocular movements intact.     Conjunctiva/sclera: Conjunctivae normal.  Cardiovascular:     Rate and Rhythm: Normal rate and regular  rhythm.     Heart sounds: Normal heart sounds. No murmur heard.    No friction rub. No gallop.  Pulmonary:     Effort: Pulmonary effort is normal. No respiratory distress.     Breath sounds: Normal breath sounds. No stridor. No wheezing, rhonchi or rales.  Musculoskeletal:     Cervical back: Normal range of motion.  Skin:    General: Skin is warm and dry.     Capillary Refill: Capillary refill takes less than 2 seconds.     Coloration: Skin is not jaundiced or pale.     Findings: Bruising present. No erythema, lesion or rash.     Comments: Well healing laceration on forehead. No signs of infection. Small hematoma noted  on medial right fifth toe  Neurological:     General: No focal deficit present.     Mental Status: He is alert and oriented to person, place, and time. Mental status is at baseline.  Psychiatric:        Mood and Affect: Mood normal.        Behavior: Behavior normal.        Thought Content: Thought content normal.        Judgment: Judgment normal.     Labs: No results found for this or any previous visit (from the past 168 hour(s)).   Assessment/Plan:   1. Fall, subsequent encounter 2. Laceration of scalp, subsequent encounter 3. Strain of right hamstring, subsequent encounter -May alternate ice and heat for hamstring pain.  Encourage maintaining light activity to avoid further muscle tightness. -May use ice on toes for pain and inflammation.  May also tape toes as shown in-office to help with pain, inflammation, and stability. - May apply small amount of vaseline to scalp if needed for dryness or itching as laceration heals. Healing very well overall. -BP stable on recheck. Instructed to check blood pressure at home at least twice a day and provide readings to the office. Tips provided for accuracy. Continue regimen as prescribed. - Warning signs for very high blood pressure discussed and when to go to the ER for evaluation. - Follow-up for CPE/med check, or sooner if needed.   Patient Instructions  You were seen today for hospital follow-up for fall, hamstring muscle strain, and scalp laceration.  Everything looks great.  Scalp is healing up very well.  No  need to apply anything to the area unless you would like to apply a little Vaseline if the area becomes dry.  You can do buddy taping similar to what I showed you in office for your toes that are hurting.  You can also use ice to that area.  You can use ice or heat for your hamstring pain. Please start checking blood pressure at least twice a day for the next few weeks and send the readings to the office.  When checking blood  pressure, it is best to sit still for 5 minutes before checking.  If using an automatic machine, tried to sit with feet flat on the floor and avoid talking while it is running to avoid inaccurate readings. Continue medications as prescribed.  If blood pressure becomes very elevated at home, or if you develop any symptoms such as dizziness, lightheadedness, passing out, almost passing out, chest pain, shortness of breath, please seek emergency evaluation. Follow up as directed by Dr. Leila.    Please contact my office for worsening conditions or problems, and seek emergency medical treatment and/or call 911 if you or  your family deems either necessary.  I have personally spent 39 minutes involved in face-to-face and non-face-to-face activities for this patient on the day of the visit.  Professional time spent includes the following activities, in addition to those noted in the documentation:  - preparing to see the patient (e.g., review of recent and/or remote lab/imaging/study results, provider notes, and patient messages/phone calls available in current EMR, CareEverywhere, and scanned records) -obtaining and/or reviewing separately obtained history either through past provider notes, patient phone calls, and/or patient's family member(s)/caregiver(s) -performing a medically appropriate examination and/or evaluation -counseling and educating the patient/family/caregiver -ordering medications, tests, or procedures -documenting clinical information in the electronic or other health record -reviewing most up to date studies or expert consensus guidelines for screening/diagnosing/treating pertinent conditions/symptoms -independently interpreting results (not separately reported) and communicating results to the patient/family/caregiver -care coordination (not separately reported) -referring and communicating with other health care professionals (when not separately reported)   Electronically signed  by: Laneta Tanda Agent, PA-C 05/05/2022 2:08 PM    Electronically signed by: Agent Laneta Tanda, PA-C 05/05/22 1410

## 2022-11-17 NOTE — Progress Notes (Signed)
 [facility] Medicare Wellness Visit Type:: Subsequent Annual Wellness Visit  Name: Jeffery Kennedy Date of Birth: [birth year] Age: 88 y.o. MRN: [id number] Visit Date: 11/17/2022  History obtained from: patient  Living Arrangements/Support System/Health Assessment/Pain/Stress Marital status: married Number of children: 3 and 5 grand 1 great Occupation: (P) Retired Location manager, farmer Living arrangements: lives with spouse/significant other Does the patient have a support system (family, friend, church, Conservation officer, nature, Catering manager)?: Yes Patient rates overall health status as: (P) very good Do you have any dental concerns?: No In the past month, have you experienced a change in your bladder control?: (!) Yes (gets up several times to void)   Do you have any difficulty obtaining your medications?: No   Do you have trouble consistently taking or remembering to take all of your medications as prescribed?: No Patient rates overall stress level as: (!) A lot Does stress affect daily life?: (!) Yes Typical amount of pain: some Does pain affect daily life?: (!) Yes Are you currently prescribed opioids?: No                Depression Screening Today's PHQ-2 results: Patient Health Questionnaire-2 Score: 0 (11/17/22 1022)  Today's PHQ-9 result:    PHQ-9 Question # 9    Interpretation: PHQ-2 Interpretation: Negative (None-minimal Depression Severity) (11/17/22 1022)    Depression Plan: Continue current psychiatric treatment plan  Social History (Tobacco/Drugs/Sexual Activity) Jeffery Kennedy reports that he quit smoking about 30 years ago. His smoking use included cigarettes. He has never used smokeless tobacco. Tobacco Use?: No How many times in the past year have you used a recreational drug or used a prescription medication for nonmedical reasons?: None Risk factors for sexually transmitted infections (i.e., multiple  sexual partners): (P) No Are you bothered by sexual problems?: (P) No  Alcohol Screening How often do you have a drink containing alcohol?: Never How many standard drinks containing alcohol do you have on a typical day?: Never, 1 or 2 drinks How often do you have six or more drinks on one occasion?: Never Audit-C Score: 0  Physical Activity Regular exercise?: Yes Exercise frequency (times per week):  (works in garden) Exercise intensity: (P) moderate (like brisk walking)  Diet How many meals a day?: 2 Eats fruit and vegetables daily?: Yes Most meals are obtained by: preparing their own meals  Home and Transportation Safety All rugs have non-skid backing?: Yes All stairs or steps have railings?: (!) No Grab bars in the bathtub or shower?: (!) No Have non-skid surface in bathtub or shower?: Yes Good home lighting?: Yes Regular seat belt use?: Yes      Activities of Daily Living Feed self?: Yes Bathe self?: Yes Dress self?: Yes Use toilet without assistance?: Yes Walk without assistance?: Yes    Instrumental Activities of Daily Living Manage finances?: Yes Shop for themselves?: Yes Prepare meals?: Yes Use the telephone?: Yes Manage medications?: Yes   Performs basic housework/laundry?: Yes Drives?: Yes Primary transportation is: driving  Hearing Concerns about hearing?: No Uses hearing aids?: No Hear whispered voice? (Observed): Yes  Vision Concerns about vision?: (!) Yes (sees double at times)    Fall Risk Is the patient ambulatory?: Yes One or more falls in the last year:: Yes Feels unsteady when walking:: No  Cognitive Assessment Has a diagnosis of dementia or cognitive impairment?: No Are there any memory concerns by the patient, others, or providers?: No  Advance Directives Living will?: Yes Advance directive information provided to patient: N/A Healthcare POA?: Yes Name of Health Care Agent: Jeffery Kennedy Relationship to patient:  Adult child Health Care Agent's phone number: [phone]         Other History I reviewed and updated the following risk factors and conditions as appropriate: Reviewed/Updated: Problem List, Medical History, Surgical History, Family History, Medications, Allergies Reviewed/Updated: Vital Signs (height, weight, and BP), Immunizations, Health Maintenance Patient Care Team Updated: Done  Vital Signs BP (!) 114/56   Pulse 55   Temp 97.8 F (36.6 C)   Resp 12   Ht 1.702 m (5' 7)   Wt 71.7 kg (158 lb)   SpO2 96%   BMI 24.75 kg/m   Screening and Immunizations Health Maintenance Status       Date Due Completion Dates   ZOSTER VACCINE (1 of 2) Never done ---   Adult RSV (60+ Years or Pregnancy) (1 - 1-dose 60+ series) Never done ---   Pneumococcal Vaccine for Ages 65+ (2 of 2 - PCV) 05/28/2011 05/27/2010   COVID-19 Vaccine (4 - 2023-24 season) 01/20/2022 05/11/2020, 07/23/2019   Diabetes: Foot Exam 03/30/2022 03/30/2021 (Done)   Comment on 03/30/2021: See [facility]   Diabetes:  eGFR for Kidney Evaluation 09/15/2022 09/14/2021, 08/24/2021   Diabetes:  uACR for Kidney Evaluation 10/26/2022 10/25/2021, 09/30/2020   Diabetes: Hemoglobin A1C 10/26/2022 10/25/2021, 03/30/2021   Influenza Vaccine (Season Ended) 12/21/2022 03/30/2021, 06/26/2018   Depression Screening 05/06/2023 05/05/2022   Comprehensive Annual Visit 11/17/2023 11/17/2022, 10/25/2021   Medicare Annual Wellness (AWV) Subsequent Visits 11/17/2023 11/17/2022, 10/25/2021   DTaP/Tdap/Td Vaccines (2 - Td or Tdap) 05/22/2029 05/23/2019, 01/20/2005       Immunization History  Administered Date(s) Administered  . Influenza, High-dose Seasonal, Quadrivalent, Preservative Free 03/30/2021  . Influenza, Unspecified 03/05/2014, 03/31/2015, 03/27/2016, 06/11/2017, 06/26/2018  . Moderna SARS-CoV-2 Primary Series 12+ yrs 06/25/2019, 07/23/2019, 05/11/2020  . Pneumococcal Polysaccharide 05/27/2010  . TD (Adult), 5 Lf Tetanus Toxoid, Preservative Free  01/20/2005  . Tdap 05/23/2019    Assessment/Plan: Subsequent Annual Wellness Visit: The topics above were reviewed with the patient.  Healthy lifestyle principles reviewed.  Recommendations provided when indicated.  Follow up 1 year for next wellness visit.  No orders of the defined types were placed in this encounter.   No orders of the defined types were placed in this encounter.   Patient Care Team: Lucie Diannia Reus, MD as PCP - General (Family Medicine)  Electronically signed by: Lucie Reus, MD 11/17/2022 10:48 AM

## 2023-05-25 ENCOUNTER — Other Ambulatory Visit: Payer: Self-pay

## 2023-05-25 ENCOUNTER — Other Ambulatory Visit (HOSPITAL_COMMUNITY): Payer: Self-pay

## 2023-05-25 MED ORDER — DULOXETINE HCL 30 MG PO CPEP
30.0000 mg | ORAL_CAPSULE | Freq: Every day | ORAL | 3 refills | Status: DC
  Filled 2023-05-25 – 2023-07-05 (×2): qty 90, 90d supply, fill #0
  Filled 2023-10-01: qty 90, 90d supply, fill #1

## 2023-05-25 MED ORDER — OMEPRAZOLE 20 MG PO CPDR
20.0000 mg | DELAYED_RELEASE_CAPSULE | Freq: Every day | ORAL | 3 refills | Status: DC
  Filled 2023-05-25: qty 90, 90d supply, fill #0

## 2023-05-25 MED ORDER — LISINOPRIL 20 MG PO TABS
20.0000 mg | ORAL_TABLET | Freq: Every day | ORAL | 3 refills | Status: DC
  Filled 2023-05-25: qty 90, 90d supply, fill #0

## 2023-05-25 MED ORDER — METOPROLOL SUCCINATE ER 50 MG PO TB24
50.0000 mg | ORAL_TABLET | Freq: Every day | ORAL | 3 refills | Status: DC
  Filled 2023-05-25 – 2023-07-05 (×2): qty 90, 90d supply, fill #0
  Filled 2023-10-01: qty 90, 90d supply, fill #1

## 2023-06-06 ENCOUNTER — Other Ambulatory Visit (HOSPITAL_COMMUNITY): Payer: Self-pay

## 2023-07-05 ENCOUNTER — Other Ambulatory Visit (HOSPITAL_COMMUNITY): Payer: Self-pay

## 2023-10-01 ENCOUNTER — Other Ambulatory Visit (HOSPITAL_COMMUNITY): Payer: Self-pay

## 2023-12-17 NOTE — Progress Notes (Signed)
 Referral sent to [facility]

## 2023-12-17 NOTE — Progress Notes (Signed)
 [facility] [place]  Return Patient Visit Jeffery Kennedy DOB: Jun 01, 1934  MRN: [id number] Visit Date: 12/17/2023  Encounter Provider: Laneta Tanda Lynwood, PA-C  Subjective Jeffery Kennedy is a 89 y.o. male who presents for Follow-up (6 month med check. Needs SAWV. Eksir pt. Pt states if he walks for awhile he has SOB. ) History of Present Illness The patient is an 89 year old male presenting for a medication check. He is accompanied by his daughter.  HYPERTENSION MANAGEMENT: He is here for hypertension follow-up. SYMPTOMS: Pertinent negatives - chest pain, chest pressure/discomfort, claudication, fatigue, irregular heart beat, lower extremity edema, near-syncope, palpitations, and syncope; endorses dyspnea on exertion HABITS: --Patient has not been following a reduced sodium diet. -- He is getting adequate exercise. HOME BLOOD PRESSURE: does not check BP at home. MEDICATIONS: He is taking antihypertensive medications correctly.  He is prescribed Toprol -XL 50 mg daily and lisinopril  20 mg daily. -He previously took amlodipine but caused elevation in creatinine. RISK FACTORS: Smoking: no Exercise: yes  Peripheral neuropathy/frequent falls Taking b12 (twice wekly) and cymbalta  30 mg daily with some improvement Gabapentin  had caused drowsiness workup: negative heavy metal screening in 2013. Elevated kappa chains but normal FLC ratio  Suspected cause: b12 def and DMT2 Located on BL distal lower exts  -He reports frequent falls due to balance issues. He believes these falls are related to the tingling sensation in his feet. His most recent fall occurred yesterday, resulting in shoulder injuries. He reports no dizziness prior to falling and does not feel a decline in strength. He has not undergone physical therapy for balance improvement and is reluctant to do so due to time constraints. However, he is open to considering therapy during the winter. He takes duloxetine  in the  morning and does not experience drowsiness as a side effect.  Type 2 diabetes SYMPTOMS: polyuria (no), polydipsia (no), polyphagia (no), neuropathy (yes), weight loss (mild), dry skin (no) HABITS: Patient has not been following his diabetic diet.  He notes he eats a lot of snacks prior to bedtime. He is getting adequate exercise.  [place]. WEIGHT: decreased by 11 pounds MEDICATIONS: He is not taking medications currently. Last A1C in the office was:  Lab Results  Component Value Date   HGBA1C 6.5 (H) 05/25/2023   Hoarseness/dysphagia -Taking omeprazole  20 mg daily with some improvement but continues to have dysphagia, especially when eating meat. He has difficulty swallowing, which is alleviated by omeprazole . He has previously consulted an ENT specialist who recommended vocal cord therapy, but he found the online sessions unhelpful. He reports no recent worsening of his swallowing difficulty and finds omeprazole  beneficial.   Dyspnea on exertion -He has been experiencing shortness of breath, particularly during hot weather or strenuous activity. This symptom has been present for a year and has progressively worsened. He occasionally experiences dizziness but reports no chest pain or palpitations. He also reports difficulty walking long distances due to the shortness of breath. He reports no leg swelling.   Behavioral Health Screening  Patient Health Questionnaire-2 Score: 0 (12/17/2023  2:04 PM)      Patient's Depression screening is Negative   Depression Plan: Normal/Negative Screening  Objective Blood pressure 112/60, pulse 52, temperature 98.1 F (36.7 C), weight 74.5 kg (164 lb 3.2 oz), SpO2 95%. Physical Exam Respiratory: Clear to auscultation, no wheezing, rales or rhonchi Cardiovascular: Regular rate and rhythm, no murmurs, rubs, or gallops Extremities: No swelling noted in the legs  Physical Exam Constitutional:  General: He is not in acute  distress.    Appearance: Normal appearance. He is not ill-appearing or toxic-appearing.  HENT:     Head: Normocephalic and atraumatic.     Right Ear: External ear normal.     Left Ear: External ear normal.     Nose: Nose normal.   Eyes:     General:        Right eye: No discharge.        Left eye: No discharge.    Cardiovascular:     Rate and Rhythm: Normal rate and regular rhythm.     Heart sounds: Normal heart sounds. No murmur heard.    No friction rub. No gallop.  Pulmonary:     Effort: Pulmonary effort is normal. No respiratory distress.     Breath sounds: Normal breath sounds. No stridor. No wheezing, rhonchi or rales.   Musculoskeletal:     Cervical back: Normal range of motion.   Skin:    General: Skin is warm and dry.     Capillary Refill: Capillary refill takes less than 2 seconds.   Neurological:     General: No focal deficit present.     Mental Status: He is alert and oriented to person, place, and time. Mental status is at baseline.   Psychiatric:        Mood and Affect: Mood normal.        Behavior: Behavior normal.        Thought Content: Thought content normal.        Judgment: Judgment normal.     Medical History: Medical History[1]  Patient Active Problem List   Diagnosis Date Noted  . History of cerebellar stroke 11/17/2022  . Dysphagia 12/22/2021  . Coronary artery disease involving native coronary artery of native heart without angina pectoris 10/25/2021  . Abdominal aortic atherosclerosis (HCC) 10/25/2021  . Hoarseness 09/21/2021  . Type 2 diabetes mellitus, without long-term current use of insulin (HCC) 09/14/2020  . Primary osteoarthritis involving multiple joints 04/10/2016  . Hypertension   . Pulmonary nodule 09/26/2011  . Peripheral neuropathy 09/26/2011  . Adenomatous colon polyp 09/08/2011   Current Medications:  Medications Ordered Prior to Encounter[2] Current Medications[3]  Allergies: Allergies[4]   Immunizations:   Immunization History  Administered Date(s) Administered  . Influenza Vaccine, Quadrivalent, Adjuvanted 03/27/2016, 06/11/2017, 06/26/2018  . Influenza, High-dose Seasonal, Quadrivalent, Preservative Free 03/30/2021  . Influenza, Injectable, Mdck, Preservative Free,quadrivalent 03/31/2015  . Influenza, Unspecified 03/05/2014, 03/31/2015, 03/27/2016, 06/11/2017, 06/26/2018  . Influenza, adjuvanted, trivalent, PF 06/11/2017, 06/26/2018  . Moderna SARS-CoV-2 Primary Series 12+ yrs 06/25/2019, 07/23/2019, 05/11/2020  . Pneumococcal Conjugate Vaccine 20-Valent (PREVNAR-20) 6 wks+ 11/17/2022  . Pneumococcal Polysaccharide Vaccine, 23 Valent (PNEUMOVAX-23) 2Y+ 05/27/2010  . TD vaccine (ADULT)PF(TENIVAC) 7Y+ 01/20/2005  . TDAP VACCINE (BOOSTRIX,ADACEL) 7Y+ 05/23/2019    Surgical History- Surgical History[5]  Family history- Family History[6] Social history- Social History[7]   Labs: No results found for this or any previous visit (from the past week).    Assessment & Plan 1. Medication monitoring encounter (Primary) - CBC with Differential - Comprehensive Metabolic Panel - Hemoglobin A1C With Estimated Average Glucose  2. Primary hypertension - He is currently on lisinopril  20 mg once daily and metoprolol  20 mg once daily. - He can take these medications either in the morning or evening. - Refills for lisinopril  and metoprolol  will be sent to pharmacy. - lisinopriL  (PRINIVIL ) 20 mg tablet; Take 1 tablet (20 mg total) by mouth daily.  Dispense: 90  tablet; Refill: 3 - metoprolol  succinate (TOPROL  XL) 50 mg 24 hr tablet; Take 1 tablet (50 mg total) by mouth daily.  Dispense: 90 tablet; Refill: 3  3. Peripheral polyneuropathy - He reports that duloxetine  30 mg/day helps with the numbness and tingling in his feet but has not completely resolved the symptoms. - The dosage of duloxetine  will be increased to 60 mg/day, to be taken in the evening to avoid daytime drowsiness. - If he  experiences any adverse effects such as dizziness, mood changes, or increased anxiety, he should contact the office immediately. - DULoxetine  (CYMBALTA ) 60 mg capsule; Take 1 capsule (60 mg total) by mouth daily.  Dispense: 90 capsule; Refill: 1 - Vitamin B12  4. Frequent falls - referral placed to [facility] in [place] for balance/gait work.  5. Type 2 diabetes mellitus with other circulatory complication, without long-term current use of insulin    (CMD) - His A1c levels are slightly elevated, indicating a borderline diabetic state. - He has lost 11 pounds since 05/2023, which is beneficial. - His A1c will be monitored. - Hemoglobin A1C With Estimated Average Glucose  6. Dysphonia 7. 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 - The dosage of omeprazole  will be increased to 40 mg/day to help with swallowing difficulties. - He is advised to take smaller bites and choose softer meats to facilitate easier swallowing. - omeprazole  (PriLOSEC) 40 mg DR capsule; Take 1 capsule (40 mg total) by mouth daily.  Dispense: 90 capsule; Refill: 3  8. Dyspnea on exertion - He experiences shortness of breath, especially in hot weather or during physical exertion. - A referral to a cardiologist will be made for further evaluation to rule out any underlying cardiac causes, especially since he is considering shoulder replacement surgery. Referral placed to [facility]. - Ambulatory referral to Cardiology; Future  Follow-up: A follow-up visit is scheduled in 6 to 8 weeks for a wellness check and to monitor the effects of the increased doses of Cymbalta  and omeprazole .     Problem List Items Addressed This Visit     Hypertension (Chronic)   Relevant Medications   lisinopriL  (PRINIVIL ) 20 mg tablet   metoprolol  succinate (TOPROL  XL) 50 mg 24 hr tablet   Peripheral neuropathy   Relevant Medications    DULoxetine  (CYMBALTA ) 60 mg capsule   Other Relevant Orders   Vitamin B12   Ambulatory referral to Physical Therapy   Type 2 diabetes mellitus, without long-term current use of insulin (HCC)   Relevant Orders   Hemoglobin A1C With Estimated Average Glucose   Dysphagia   Other Visit Diagnoses       Medication monitoring encounter    -  Primary   Relevant Orders   CBC with Differential   Comprehensive Metabolic Panel   Hemoglobin A1C With Estimated Average Glucose     Frequent falls       Relevant Orders   Ambulatory referral to Physical Therapy     Dysphonia       Relevant Medications   omeprazole  (PriLOSEC) 40 mg DR capsule     Dyspnea on exertion       Relevant Orders   Ambulatory referral to Cardiology       Monitor: The problem is unchanged Evaluation: Labs/tests ordered, see encounter summary Assessment/Treatment Follow up in 1 month  Please contact my office for worsening conditions or problems, and seek emergency medical treatment and/or  call 911 if you or your family deems either necessary.  Patient Instructions  Treatment Plan: Increase duloxetine  dosage to 60 mg/day, to be taken in the evening. Increase omeprazole  dosage to 40 mg/day. Continue lisinopril  20 mg once daily and metoprolol  once daily. Referral for physical therapy to improve balance. I placed referral to [facility] PT in [place]. Their phone number is  [phone] Referral to a cardiologist for further evaluation of shortness of breath and clearance for shoulder replacement surgery. I put in referral to [facility]. Blood work to check vitamin B12 levels and other basic parameters.   Follow-Up Instructions: Take duloxetine  60 mg in the evening. Take omeprazole  40 mg once daily. Continue taking lisinopril  and metoprolol  as prescribed. Schedule and attend physical therapy sessions for balance improvement. Follow up with a cardiologist for shortness of breath  evaluation and surgery clearance. Return for a follow-up visit in 6 to 8 weeks for a wellness check and to monitor the effects of the increased doses of duloxetine  and omeprazole . Contact the office immediately if experiencing any adverse effects from the increased duloxetine  dosage, such as dizziness, mood changes, or increased anxiety.  Return for 6-8 week f/u neuropathy/dysphagia with SAWV/CPE/med ck/fasting labs.  Portions of this note were created using [employer] Copilot software. Although proofread, errors may be present.  I have personally spent 35 minutes involved in face-to-face and non-face-to-face activities for this patient on the day of the visit.  Professional time spent includes the following activities, in addition to those noted in the documentation:  - preparing to see the patient (e.g., review of recent and/or remote lab/imaging/study results, provider notes, and patient messages/phone calls available in current EMR, CareEverywhere, and scanned records) -obtaining and/or reviewing separately obtained history either through past provider notes, patient phone calls, and/or patient's family member(s)/caregiver(s) -performing a medically appropriate examination and/or evaluation -counseling and educating the patient/family/caregiver -ordering medications, tests, or procedures -documenting clinical information in the electronic or other health record -reviewing most up to date studies or expert consensus guidelines for screening/diagnosing/treating pertinent conditions/symptoms -independently interpreting results (not separately reported) and communicating results to the patient/family/caregiver -care coordination (not separately reported) -referring and communicating with other health care professionals (when not separately reported)  Laneta Tanda Agent, PA-C 3:09 PM      [1] Past Medical History: Diagnosis Date  . Acid reflux   . Crush injury to finger    right long and  ring finger  . History of cerebellar stroke 11/17/2022  . Hypertension   . Laceration of blood vessel of right ring finger 04/07/2015  . Laceration of extensor muscle, fascia and tendon of right ring finger at wrist and hand level, subsequent encounter 04/07/2015  . Laceration of nail bed of finger    right long finger  . Laceration of radial artery    right  . Laceration of right middle finger without foreign body with damage to nail 04/07/2015  . Neuropathy   . Open displaced fracture of middle phalanx of right ring finger 04/07/2015  . Vitamin B12 deficiency 10/03/2011   2013    [2] Current Outpatient Medications on File Prior to Visit  Medication Sig Dispense Refill  . aspirin 81 mg chewable tablet Chew 81 mg daily.    . cyanocobalamin (VITAMIN B12) 1,000 mcg tablet Take 500 mcg by mouth 2 (two) times weekly.    . vitamins A,C,E-zinc-copper (ICaps AREDS) 4,296 mcg-226 mg-90 mg cap Take 1 capsule by mouth Once Daily.    . [  DISCONTINUED] DULoxetine  (CYMBALTA ) 30 mg capsule Take 1 capsule (30 mg total) by mouth daily. 90 capsule 3  . [DISCONTINUED] lisinopriL  (PRINIVIL ) 20 mg tablet Take 1 tablet (20 mg total) by mouth daily. 90 tablet 3  . [DISCONTINUED] metoprolol  succinate (TOPROL  XL) 50 mg 24 hr tablet Take 1 tablet (50 mg total) by mouth daily. 90 tablet 3  . [DISCONTINUED] omeprazole  (PriLOSEC) 20 mg DR capsule Take 1 capsule (20 mg total) by mouth daily. 90 capsule 3   No current facility-administered medications on file prior to visit.  [3]  Current Outpatient Medications:  .  aspirin 81 mg chewable tablet, Chew 81 mg daily., Disp: , Rfl:  .  cyanocobalamin (VITAMIN B12) 1,000 mcg tablet, Take 500 mcg by mouth 2 (two) times weekly., Disp: , Rfl:  .  vitamins A,C,E-zinc-copper (ICaps AREDS) 4,296 mcg-226 mg-90 mg cap, Take 1 capsule by mouth Once Daily., Disp: , Rfl:  .  DULoxetine  (CYMBALTA ) 60 mg capsule, Take 1 capsule (60 mg total) by mouth daily., Disp: 90 capsule, Rfl:  1 .  lisinopriL  (PRINIVIL ) 20 mg tablet, Take 1 tablet (20 mg total) by mouth daily., Disp: 90 tablet, Rfl: 3 .  metoprolol  succinate (TOPROL  XL) 50 mg 24 hr tablet, Take 1 tablet (50 mg total) by mouth daily., Disp: 90 tablet, Rfl: 3 .  omeprazole  (PriLOSEC) 40 mg DR capsule, Take 1 capsule (40 mg total) by mouth daily., Disp: 90 capsule, Rfl: 3 [4] No Known Allergies [5] Past Surgical History: Procedure Laterality Date  . EYE SURGERY    . FINGER AMPUTATION     Procedure: FINGER AMPUTATION; right small finger  . HEMORRHOID SURGERY     Procedure: HEMORRHOID SURGERY  . HERNIA REPAIR Bilateral    Procedure: HERNIA REPAIR  . JOINT REPLACEMENT    . ORIF FINGER / THUMB FRACTURE Right 03/12/2015   Procedure: ORIF FINGER / THUMB FRACTURE; ring finger open middle phalanx fracture  . OTHER SURGICAL HISTORY     Procedure: OTHER SURGICAL HISTORY (OTHER SURGICAL HISTORY); repair of right ring finger radial digital artery uncer microscope  . OTHER SURGICAL HISTORY Right 03/12/2015   Procedure: OTHER SURGICAL HISTORY (OTHER SURGICAL HISTORY); right long finger repair of laceration and nail bed laceration  . REPAIR EXTENSOR TENDON HAND Right    Procedure: EXTENSOR TENDON REPAIR HAND; ring finger  . TOTAL KNEE ARTHROPLASTY Right    Procedure: REPLACEMENT TOTAL KNEE  [6] Family History Problem Relation Name Age of Onset  . Diabetes Father Arminius Wolak   . Prostate cancer Brother Josep Luviano   . Cancer Brother Jeovanny Cuadros   . Bladder Cancer Brother    . Bladder Cancer Brother    . Lung cancer Brother    [7] Social History Socioeconomic History  . Marital status: Married  Tobacco Use  . Smoking status: Former    Current packs/day: 0.00    Types: Cigarettes    Quit date: 05/22/1992    Years since quitting: 31.5  . Smokeless tobacco: Never  . Tobacco comments:    quit 22 yrs ago  Substance and Sexual Activity  . Alcohol use: Not Currently  . Drug use: Never  . Sexual activity: Not  Currently    Partners: Female   Social Drivers of Health   Food Insecurity: Low Risk  (05/25/2023)   Food vital sign   . Within the past 12 months, you worried that your food would run out before you got money to buy more: Never true   .  Within the past 12 months, the food you bought just didn't last and you didn't have money to get more: Never true  Transportation Needs: No Transportation Needs (05/25/2023)   Transportation   . In the past 12 months, has lack of reliable transportation kept you from medical appointments, meetings, work or from getting things needed for daily living? : No  Safety: Low Risk  (05/25/2023)   Safety   . How often does anyone, including family and friends, physically hurt you?: Never   . How often does anyone, including family and friends, insult or talk down to you?: Never   . How often does anyone, including family and friends, threaten you with harm?: Never   . How often does anyone, including family and friends, scream or curse at you?: Never  Living Situation: Low Risk  (05/25/2023)   Living Situation   . What is your living situation today?: I have a steady place to live   . Think about the place you live. Do you have problems with any of the following? Choose all that apply:: None/None on this list

## 2023-12-19 NOTE — Progress Notes (Signed)
 Attempted to reach pt, no answer, lvm with lab results.   Live Answer: When patient returns call, please communicate the follow information:  Live Answer can communication the following information: Please call the patient to report the following: Labs showed mild anemia, but vitamin levels are normal, so no supplementation is needed.  It is common for people over age 87 to have mild anemia that is not associated with any particular deficiency.  This is not of concern right now and we will monitor.  Vitamin B12 levels are normal.  You can continue supplementing with current dose.  No sign of infection.  Normal electrolytes and normal kidney and liver function.  A1c is at 6.6%, which remains in diabetes range but does not require treatment with medication.

## 2023-12-19 NOTE — Progress Notes (Signed)
 Please call the patient to report the following: Labs showed mild anemia, but vitamin levels are normal, so no supplementation is needed.  It is common for people over age 87 to have mild anemia that is not associated with any particular deficiency.  This is not of concern right now and we will monitor.  Vitamin B12 levels are normal.  You can continue supplementing with current dose.  No sign of infection.  Normal electrolytes and normal kidney and liver function.  A1c is at 6.6%, which remains in diabetes range but does not require treatment with medication.

## 2023-12-20 ENCOUNTER — Telehealth: Payer: Self-pay | Admitting: *Deleted

## 2023-12-20 NOTE — Telephone Encounter (Signed)
 Our [facility] team sent me a staff message that our office received a referral to cardiology from Laneta Agent, [facility].    I reviewed the referral to locate the name of the surgeon office or name of surgeon so that I would know who to contact for the surgery clearance request. I did not see anything in the referral from PCP. I called PCP office and s/w [place] who works in their [facility] area. She reviewed the referral as well and did not see noted about surgeon office who to contact. I stated that I left a message for the pt to call back and let us  know who the surgeon is so we may contact the surgeon office on his behalf.   Once we have all information we will be happy to schedule a new pt appt per the referral for DOE and preop clearance.

## 2023-12-24 ENCOUNTER — Other Ambulatory Visit (HOSPITAL_COMMUNITY): Payer: Self-pay

## 2023-12-25 ENCOUNTER — Other Ambulatory Visit (HOSPITAL_COMMUNITY): Payer: Self-pay

## 2023-12-25 ENCOUNTER — Telehealth: Payer: Self-pay

## 2023-12-25 MED ORDER — METOPROLOL SUCCINATE ER 50 MG PO TB24
50.0000 mg | ORAL_TABLET | Freq: Every day | ORAL | 3 refills | Status: AC
  Filled 2023-12-31: qty 90, 90d supply, fill #0
  Filled 2024-04-06: qty 90, 90d supply, fill #1

## 2023-12-25 MED ORDER — DULOXETINE HCL 60 MG PO CPEP
60.0000 mg | ORAL_CAPSULE | Freq: Every day | ORAL | 1 refills | Status: AC
  Filled 2023-12-31: qty 90, 90d supply, fill #0
  Filled 2024-04-06: qty 90, 90d supply, fill #1

## 2023-12-25 MED ORDER — OMEPRAZOLE 40 MG PO CPDR
40.0000 mg | DELAYED_RELEASE_CAPSULE | Freq: Every day | ORAL | 3 refills | Status: AC
  Filled 2023-12-31: qty 90, 90d supply, fill #0
  Filled 2024-04-06: qty 90, 90d supply, fill #1

## 2023-12-25 MED ORDER — LISINOPRIL 20 MG PO TABS
20.0000 mg | ORAL_TABLET | Freq: Every day | ORAL | 3 refills | Status: AC
  Filled 2023-12-31: qty 90, 90d supply, fill #0
  Filled 2024-04-06: qty 90, 90d supply, fill #1
  Filled 2024-06-21 – 2024-06-23 (×2): qty 90, 90d supply, fill #2

## 2023-12-25 NOTE — Telephone Encounter (Signed)
 Left message for pt to call our office and schedule NEW PT appt.

## 2023-12-25 NOTE — Telephone Encounter (Signed)
-----   Message from [place] F sent at 12/20/2023 12:57 PM EDT ----- Regarding: Pre-Op Clearance New Patient Referral Good afternoon,  We received a referral for this patient to have pre-op clearance. It looks as if they may need a shoulder replacement. This patient is not established and would need to be scheduled as a new patient. Hopefully they can be a perfect candidate for heart first.  Thanks, Rosina Server

## 2023-12-28 NOTE — Telephone Encounter (Signed)
 Spoke with doctor office that sent the referral, they are going to call back or fax with the information for the preop clearance

## 2023-12-28 NOTE — Telephone Encounter (Signed)
 Spoke to [place] at [facility] in [place]. I informed her that we are no longer at [facility] and that we moved almost 3.5 months ago. I gave her our updated phone number and fax number. She sent the information to the nurse and marked it high priority. Will monitor pre-op [employer] to see when requests arrive for the patient.

## 2024-01-01 ENCOUNTER — Other Ambulatory Visit (HOSPITAL_COMMUNITY): Payer: Self-pay

## 2024-03-19 ENCOUNTER — Other Ambulatory Visit: Payer: Self-pay

## 2024-03-19 ENCOUNTER — Other Ambulatory Visit (HOSPITAL_COMMUNITY): Payer: Self-pay

## 2024-03-19 MED ORDER — AMLODIPINE BESYLATE 10 MG PO TABS
10.0000 mg | ORAL_TABLET | Freq: Every day | ORAL | 0 refills | Status: DC
  Filled 2024-03-19: qty 30, 30d supply, fill #0

## 2024-03-21 ENCOUNTER — Other Ambulatory Visit (HOSPITAL_COMMUNITY): Payer: Self-pay

## 2024-04-07 ENCOUNTER — Other Ambulatory Visit (HOSPITAL_COMMUNITY): Payer: Self-pay

## 2024-04-11 ENCOUNTER — Other Ambulatory Visit (HOSPITAL_COMMUNITY): Payer: Self-pay

## 2024-04-15 ENCOUNTER — Other Ambulatory Visit: Payer: Self-pay

## 2024-04-15 ENCOUNTER — Other Ambulatory Visit (HOSPITAL_COMMUNITY): Payer: Self-pay

## 2024-04-15 MED ORDER — AMLODIPINE BESYLATE 10 MG PO TABS
10.0000 mg | ORAL_TABLET | Freq: Every day | ORAL | 0 refills | Status: DC
  Filled 2024-04-15: qty 30, 30d supply, fill #0

## 2024-05-08 ENCOUNTER — Other Ambulatory Visit (HOSPITAL_COMMUNITY): Payer: Self-pay

## 2024-05-12 ENCOUNTER — Other Ambulatory Visit (HOSPITAL_COMMUNITY): Payer: Self-pay

## 2024-05-12 MED ORDER — AMLODIPINE BESYLATE 10 MG PO TABS
10.0000 mg | ORAL_TABLET | Freq: Every day | ORAL | 6 refills | Status: AC
  Filled 2024-05-12: qty 30, 30d supply, fill #0
  Filled 2024-06-17: qty 30, 30d supply, fill #1

## 2024-05-20 ENCOUNTER — Other Ambulatory Visit (HOSPITAL_COMMUNITY): Payer: Self-pay

## 2024-05-20 MED ORDER — METOPROLOL SUCCINATE ER 25 MG PO TB24
12.5000 mg | ORAL_TABLET | Freq: Every day | ORAL | 0 refills | Status: AC
  Filled 2024-05-20 (×2): qty 45, 90d supply, fill #0

## 2024-05-20 MED ORDER — FUROSEMIDE 20 MG PO TABS
ORAL_TABLET | ORAL | 3 refills | Status: AC
  Filled 2024-05-20 (×2): qty 90, 87d supply, fill #0

## 2024-06-17 ENCOUNTER — Other Ambulatory Visit (HOSPITAL_COMMUNITY): Payer: Self-pay

## 2024-06-21 ENCOUNTER — Other Ambulatory Visit (HOSPITAL_COMMUNITY): Payer: Self-pay

## 2024-06-21 MED ORDER — PANTOPRAZOLE SODIUM 40 MG PO TBEC
40.0000 mg | DELAYED_RELEASE_TABLET | Freq: Every morning | ORAL | 0 refills | Status: DC
  Filled 2024-06-21: qty 90, 90d supply, fill #0

## 2024-06-21 MED ORDER — ATORVASTATIN CALCIUM 40 MG PO TABS
40.0000 mg | ORAL_TABLET | Freq: Every day | ORAL | 3 refills | Status: DC
  Filled 2024-06-21: qty 90, 90d supply, fill #0

## 2024-06-21 MED ORDER — CLOPIDOGREL BISULFATE 75 MG PO TABS
75.0000 mg | ORAL_TABLET | Freq: Every day | ORAL | 0 refills | Status: DC
  Filled 2024-06-21: qty 90, 90d supply, fill #0

## 2024-06-23 ENCOUNTER — Other Ambulatory Visit (HOSPITAL_COMMUNITY): Payer: Self-pay

## 2024-06-23 ENCOUNTER — Other Ambulatory Visit: Payer: Self-pay
